# Patient Record
Sex: Female | Born: 1960 | Race: White | Hispanic: No | Smoking: Never smoker
Health system: Southern US, Community
[De-identification: ages and names within clinical notes are randomized; demographics above are authoritative.]

## PROBLEM LIST (undated history)

## (undated) DIAGNOSIS — F4310 Post-traumatic stress disorder, unspecified: Secondary | ICD-10-CM

## (undated) DIAGNOSIS — J45909 Unspecified asthma, uncomplicated: Secondary | ICD-10-CM

## (undated) DIAGNOSIS — E785 Hyperlipidemia, unspecified: Secondary | ICD-10-CM

## (undated) DIAGNOSIS — I10 Essential (primary) hypertension: Secondary | ICD-10-CM

## (undated) DIAGNOSIS — E039 Hypothyroidism, unspecified: Secondary | ICD-10-CM

## (undated) DIAGNOSIS — M797 Fibromyalgia: Secondary | ICD-10-CM

## (undated) DIAGNOSIS — G43909 Migraine, unspecified, not intractable, without status migrainosus: Secondary | ICD-10-CM

## (undated) HISTORY — DX: Fibromyalgia: M79.7

## (undated) HISTORY — DX: Unspecified asthma, uncomplicated: J45.909

## (undated) HISTORY — PX: CHOLECYSTECTOMY: SHX55

## (undated) HISTORY — PX: ADENOIDECTOMY: SUR15

## (undated) HISTORY — PX: OTHER SURGICAL HISTORY: SHX169

## (undated) HISTORY — DX: Hypothyroidism, unspecified: E03.9

## (undated) HISTORY — DX: Migraine, unspecified, not intractable, without status migrainosus: G43.909

---

## 2016-05-03 ENCOUNTER — Ambulatory Visit: Payer: BLUE CROSS/BLUE SHIELD | Admitting: Neurology

## 2016-05-03 ENCOUNTER — Telehealth: Payer: Self-pay | Admitting: Neurology

## 2016-05-03 NOTE — Telephone Encounter (Signed)
This patient did not show for a new patient appointment today. 

## 2016-05-09 ENCOUNTER — Encounter: Payer: Self-pay | Admitting: Neurology

## 2019-06-26 ENCOUNTER — Ambulatory Visit (INDEPENDENT_AMBULATORY_CARE_PROVIDER_SITE_OTHER): Payer: 59 | Admitting: Nurse Practitioner

## 2019-06-26 ENCOUNTER — Encounter: Payer: Self-pay | Admitting: Nurse Practitioner

## 2019-06-26 ENCOUNTER — Other Ambulatory Visit: Payer: Self-pay

## 2019-06-26 VITALS — BP 145/88 | HR 60 | Temp 98.4°F | Ht 64.0 in | Wt 192.8 lb

## 2019-06-26 DIAGNOSIS — R82998 Other abnormal findings in urine: Secondary | ICD-10-CM

## 2019-06-26 DIAGNOSIS — E039 Hypothyroidism, unspecified: Secondary | ICD-10-CM | POA: Diagnosis not present

## 2019-06-26 DIAGNOSIS — Z0000 Encounter for general adult medical examination without abnormal findings: Secondary | ICD-10-CM

## 2019-06-26 DIAGNOSIS — J4520 Mild intermittent asthma, uncomplicated: Secondary | ICD-10-CM

## 2019-06-26 DIAGNOSIS — J329 Chronic sinusitis, unspecified: Secondary | ICD-10-CM

## 2019-06-26 DIAGNOSIS — F419 Anxiety disorder, unspecified: Secondary | ICD-10-CM

## 2019-06-26 DIAGNOSIS — M797 Fibromyalgia: Secondary | ICD-10-CM

## 2019-06-26 LAB — POCT URINALYSIS DIPSTICK
Bilirubin, UA: NEGATIVE
Blood, UA: NEGATIVE
Glucose, UA: NEGATIVE
Ketones, UA: NEGATIVE
Nitrite, UA: NEGATIVE
Protein, UA: POSITIVE — AB
Spec Grav, UA: >=1.03 — AB (ref 1.010–1.025)
Urobilinogen, UA: 0.2 E.U./dL
pH, UA: 6 (ref 5.0–8.0)

## 2019-06-26 LAB — POCT GLYCOSYLATED HEMOGLOBIN (HGB A1C): Hemoglobin A1C: 5.4 % (ref 4.0–5.6)

## 2019-06-26 LAB — GLUCOSE, POCT (MANUAL RESULT ENTRY): POC Glucose: 97 mg/dl (ref 70–99)

## 2019-06-26 MED ORDER — AMOXICILLIN-POT CLAVULANATE 875-125 MG PO TABS: 1.0000 | ORAL_TABLET | Freq: Two times a day (BID) | ORAL | 0 refills | Status: AC

## 2019-06-26 NOTE — Patient Instructions (Addendum)
Managing Anxiety, Adult After being diagnosed with an anxiety disorder, you may be relieved to know why you have felt or behaved a certain way. You may also feel overwhelmed about the treatment ahead and what it will mean for your life. With care and support, you can manage this condition and recover from it. How to manage lifestyle changes Managing stress and anxiety  Stress is your body's reaction to life changes and events, both good and bad. Most stress will last just a few hours, but stress can be ongoing and can lead to more than just stress. Although stress can play a major role in anxiety, it is not the same as anxiety. Stress is usually caused by something external, such as a deadline, test, or competition. Stress normally passes after the triggering event has ended.  Anxiety is caused by something internal, such as imagining a terrible outcome or worrying that something will go wrong that will devastate you. Anxiety often does not go away even after the triggering event is over, and it can become long-term (chronic) worry. It is important to understand the differences between stress and anxiety and to manage your stress effectively so that it does not lead to an anxious response. Talk with your health care provider or a counselor to learn more about reducing anxiety and stress. He or she may suggest tension reduction techniques, such as:  Music therapy. This can include creating or listening to music that you enjoy and that inspires you.  Mindfulness-based meditation. This involves being aware of your normal breaths while not trying to control your breathing. It can be done while sitting or walking.  Centering prayer. This involves focusing on a word, phrase, or sacred image that means something to you and brings you peace.  Deep breathing. To do this, expand your stomach and inhale slowly through your nose. Hold your breath for 3-5 seconds. Then exhale slowly, letting your stomach muscles  relax.  Self-talk. This involves identifying thought patterns that lead to anxiety reactions and changing those patterns.  Muscle relaxation. This involves tensing muscles and then relaxing them. Choose a tension reduction technique that suits your lifestyle and personality. These techniques take time and practice. Set aside 5-15 minutes a day to do them. Therapists can offer counseling and training in these techniques. The training to help with anxiety may be covered by some insurance plans. Other things you can do to manage stress and anxiety include:  Keeping a stress/anxiety diary. This can help you learn what triggers your reaction and then learn ways to manage your response.  Thinking about how you react to certain situations. You may not be able to control everything, but you can control your response.  Making time for activities that help you relax and not feeling guilty about spending your time in this way.  Visual imagery and yoga can help you stay calm and relax.  Medicines Medicines can help ease symptoms. Medicines for anxiety include:  Anti-anxiety drugs.  Antidepressants. Medicines are often used as a primary treatment for anxiety disorder. Medicines will be prescribed by a health care provider. When used together, medicines, psychotherapy, and tension reduction techniques may be the most effective treatment. Relationships Relationships can play a big part in helping you recover. Try to spend more time connecting with trusted friends and family members. Consider going to couples counseling, taking family education classes, or going to family therapy. Therapy can help you and others better understand your condition. How to recognize changes in your   anxiety Everyone responds differently to treatment for anxiety. Recovery from anxiety happens when symptoms decrease and stop interfering with your daily activities at home or work. This may mean that you will start to:  Have  better concentration and focus. Worry will interfere less in your daily thinking.  Sleep better.  Be less irritable.  Have more energy.  Have improved memory. It is important to recognize when your condition is getting worse. Contact your health care provider if your symptoms interfere with home or work and you feel like your condition is not improving. Follow these instructions at home: Activity  Exercise. Most adults should do the following: ? Exercise for at least 150 minutes each week. The exercise should increase your heart rate and make you sweat (moderate-intensity exercise). ? Strengthening exercises at least twice a week.  Get the right amount and quality of sleep. Most adults need 7-9 hours of sleep each night. Lifestyle   Eat a healthy diet that includes plenty of vegetables, fruits, whole grains, low-fat dairy products, and lean protein. Do not eat a lot of foods that are high in solid fats, added sugars, or salt.  Make choices that simplify your life.  Do not use any products that contain nicotine or tobacco, such as cigarettes, e-cigarettes, and chewing tobacco. If you need help quitting, ask your health care provider.  Avoid caffeine, alcohol, and certain over-the-counter cold medicines. These may make you feel worse. Ask your pharmacist which medicines to avoid. General instructions  Take over-the-counter and prescription medicines only as told by your health care provider.  Keep all follow-up visits as told by your health care provider. This is important. Where to find support You can get help and support from these sources:  Self-help groups.  Online and community organizations.  A trusted spiritual leader.  Couples counseling.  Family education classes.  Family therapy. Where to find more information You may find that joining a support group helps you deal with your anxiety. The following sources can help you locate counselors or support groups near  you:  Mental Health America: www.mentalhealthamerica.net  Anxiety and Depression Association of America (ADAA): www.adaa.org  National Alliance on Mental Illness (NAMI): www.nami.org Contact a health care provider if you:  Have a hard time staying focused or finishing daily tasks.  Spend many hours a day feeling worried about everyday life.  Become exhausted by worry.  Start to have headaches, feel tense, or have nausea.  Urinate more than normal.  Have diarrhea. Get help right away if you have:  A racing heart and shortness of breath.  Thoughts of hurting yourself or others. If you ever feel like you may hurt yourself or others, or have thoughts about taking your own life, get help right away. You can go to your nearest emergency department or call:  Your local emergency services (911 in the [place]  A suicide crisis helpline, such as the National Suicide Prevention Lifeline at 1-800-273-8255. This is open 24 hours a day. Summary  Taking steps to learn and use tension reduction techniques can help calm you and help prevent triggering an anxiety reaction.  When used together, medicines, psychotherapy, and tension reduction techniques may be the most effective treatment.  Family, friends, and partners can play a big part in helping you recover from an anxiety disorder. This information is not intended to replace advice given to you by your health care provider. Make sure you discuss any questions you have with your health care provider. Document Revised:   07/29/2018 Document Reviewed: 07/29/2018 Elsevier Patient Education  2020 [employer]   Myofascial Pain Syndrome and Fibromyalgia Myofascial pain syndrome and fibromyalgia are both pain disorders. This pain may be felt mainly in your muscles.  Myofascial pain syndrome: ? Always has tender points in the muscle that will cause pain when pressed (trigger points). The pain may come and go. ? Usually affects your neck,  upper back, and shoulder areas. The pain often radiates into your arms and hands.  Fibromyalgia: ? Has muscle pains and tenderness that come and go. ? Is often associated with fatigue and sleep problems. ? Has trigger points. ? Tends to be long-lasting (chronic), but is not life-threatening. Fibromyalgia and myofascial pain syndrome are not the same. However, they often occur together. If you have both conditions, each can make the other worse. Both are common and can cause enough pain and fatigue to make day-to-day activities difficult. Both can be hard to diagnose because their symptoms are common in many other conditions. What are the causes? The exact causes of these conditions are not known. What increases the risk? You are more likely to develop this condition if:  You have a family history of the condition.  You have certain triggers, such as: ? Spine disorders. ? An injury (trauma) or other physical stressors. ? Being under a lot of stress. ? Medical conditions such as osteoarthritis, rheumatoid arthritis, or lupus. What are the signs or symptoms? Fibromyalgia The main symptom of fibromyalgia is widespread pain and tenderness in your muscles. Pain is sometimes described as stabbing, shooting, or burning. You may also have:  Tingling or numbness.  Sleep problems and fatigue.  Problems with attention and concentration (fibro fog). Other symptoms may include:  Bowel and bladder problems.  Headaches.  Visual problems.  Problems with odors and noises.  Depression or mood changes.  Painful menstrual periods (dysmenorrhea).  Dry skin or eyes. These symptoms can vary over time. Myofascial pain syndrome Symptoms of myofascial pain syndrome include:  Tight, ropy bands of muscle.  Uncomfortable sensations in muscle areas. These may include aching, cramping, burning, numbness, tingling, and weakness.  Difficulty moving certain parts of the body freely (poor range of  motion). How is this diagnosed? This condition may be diagnosed by your symptoms and medical history. You will also have a physical exam. In general:  Fibromyalgia is diagnosed if you have pain, fatigue, and other symptoms for more than 3 months, and symptoms cannot be explained by another condition.  Myofascial pain syndrome is diagnosed if you have trigger points in your muscles, and those trigger points are tender and cause pain elsewhere in your body (referred pain). How is this treated? Treatment for these conditions depends on the type that you have.  For fibromyalgia: ? Pain medicines, such as NSAIDs. ? Medicines for treating depression. ? Medicines for treating seizures. ? Medicines that relax the muscles.  For myofascial pain: ? Pain medicines, such as NSAIDs. ? Cooling and stretching of muscles. ? Trigger point injections. ? Sound wave (ultrasound) treatments to stimulate muscles. Treating these conditions often requires a team of health care providers. These may include:  Your primary care provider.  Physical therapist.  Complementary health care providers, such as massage therapists or acupuncturists.  Psychiatrist for cognitive behavioral therapy. Follow these instructions at home: Medicines  Take over-the-counter and prescription medicines only as told by your health care provider.  Do not drive or use heavy machinery while taking prescription pain medicine.  If you are  taking prescription pain medicine, take actions to prevent or treat constipation. Your health care provider may recommend that you: ? Drink enough fluid to keep your urine pale yellow. ? Eat foods that are high in fiber, such as fresh fruits and vegetables, whole grains, and beans. ? Limit foods that are high in fat and processed sugars, such as fried or sweet foods. ? Take an over-the-counter or prescription medicine for constipation. Lifestyle   Exercise as directed by your health care  provider or physical therapist.  Practice relaxation techniques to control your stress. You may want to try: ? Biofeedback. ? Visual imagery. ? Hypnosis. ? Muscle relaxation. ? Yoga. ? Meditation.  Maintain a healthy lifestyle. This includes eating a healthy diet and getting enough sleep.  Do not use any products that contain nicotine or tobacco, such as cigarettes and e-cigarettes. If you need help quitting, ask your health care provider. General instructions  Talk to your health care provider about complementary treatments, such as acupuncture or massage.  Consider joining a support group with others who are diagnosed with this condition.  Do not do activities that stress or strain your muscles. This includes repetitive motions and heavy lifting.  Keep all follow-up visits as told by your health care provider. This is important. Where to find more information  National Fibromyalgia Association: www.fmaware.org  Arthritis Foundation: www.arthritis.org  American Chronic Pain Association: www.theacpa.org Contact a health care provider if:  You have new symptoms.  Your symptoms get worse or your pain is severe.  You have side effects from your medicines.  You have trouble sleeping.  Your condition is causing depression or anxiety. Summary  Myofascial pain syndrome and fibromyalgia are pain disorders.  Myofascial pain syndrome has tender points in the muscle that will cause pain when pressed (trigger points). Fibromyalgia also has muscle pains and tenderness that come and go, but this condition is often associated with fatigue and sleep disturbances.  Fibromyalgia and myofascial pain syndrome are not the same but often occur together, causing pain and fatigue that make day-to-day activities difficult.  Treatment for fibromyalgia includes taking medicines to relax the muscles and medicines for pain, depression, or seizures. Treatment for myofascial pain syndrome includes  taking medicines for pain, cooling and stretching of muscles, and injecting medicines into trigger points.  Follow your health care provider's instructions for taking medicines and maintaining a healthy lifestyle. This information is not intended to replace advice given to you by your health care provider. Make sure you discuss any questions you have with your health care provider. Document Revised: 06/20/2018 Document Reviewed: 03/13/2017 Elsevier Patient Education  2020 [employer].   Sinusitis, Adult Sinusitis is soreness and swelling (inflammation) of your sinuses. Sinuses are hollow spaces in the bones around your face. They are located:  Around your eyes.  In the middle of your forehead.  Behind your nose.  In your cheekbones. Your sinuses and nasal passages are lined with a fluid called mucus. Mucus drains out of your sinuses. Swelling can trap mucus in your sinuses. This lets germs (bacteria, virus, or fungus) grow, which leads to infection. Most of the time, this condition is caused by a virus. What are the causes? This condition is caused by:  Allergies.  Asthma.  Germs.  Things that block your nose or sinuses.  Growths in the nose (nasal polyps).  Chemicals or irritants in the air.  Fungus (rare). What increases the risk? You are more likely to develop this condition if:  You  have a weak body defense system (immune system).  You do a lot of swimming or diving.  You use nasal sprays too much.  You smoke. What are the signs or symptoms? The main symptoms of this condition are pain and a feeling of pressure around the sinuses. Other symptoms include:  Stuffy nose (congestion).  Runny nose (drainage).  Swelling and warmth in the sinuses.  Headache.  Toothache.  A cough that may get worse at night.  Mucus that collects in the throat or the back of the nose (postnasal drip).  Being unable to smell and taste.  Being very tired (fatigue).  A  fever.  Sore throat.  Bad breath. How is this diagnosed? This condition is diagnosed based on:  Your symptoms.  Your medical history.  A physical exam.  Tests to find out if your condition is short-term (acute) or long-term (chronic). Your doctor may: ? Check your nose for growths (polyps). ? Check your sinuses using a tool that has a light (endoscope). ? Check for allergies or germs. ? Do imaging tests, such as an MRI or CT scan. How is this treated? Treatment for this condition depends on the cause and whether it is short-term or long-term.  If caused by a virus, your symptoms should go away on their own within 10 days. You may be given medicines to relieve symptoms. They include: ? Medicines that shrink swollen tissue in the nose. ? Medicines that treat allergies (antihistamines). ? A spray that treats swelling of the nostrils. ? Rinses that help get rid of thick mucus in your nose (nasal saline washes).  If caused by bacteria, your doctor may wait to see if you will get better without treatment. You may be given antibiotic medicine if you have: ? A very bad infection. ? A weak body defense system.  If caused by growths in the nose, you may need to have surgery. Follow these instructions at home: Medicines  Take, use, or apply over-the-counter and prescription medicines only as told by your doctor. These may include nasal sprays.  If you were prescribed an antibiotic medicine, take it as told by your doctor. Do not stop taking the antibiotic even if you start to feel better. Hydrate and humidify   Drink enough water to keep your pee (urine) pale yellow.  Use a cool mist humidifier to keep the humidity level in your home above 50%.  Breathe in steam for 10-15 minutes, 3-4 times a day, or as told by your doctor. You can do this in the bathroom while a hot shower is running.  Try not to spend time in cool or dry air. Rest  Rest as much as you can.  Sleep with  your head raised (elevated).  Make sure you get enough sleep each night. General instructions   Put a warm, moist washcloth on your face 3-4 times a day, or as often as told by your doctor. This will help with discomfort.  Wash your hands often with soap and water. If there is no soap and water, use hand sanitizer.  Do not smoke. Avoid being around people who are smoking (secondhand smoke).  Keep all follow-up visits as told by your doctor. This is important. Contact a doctor if:  You have a fever.  Your symptoms get worse.  Your symptoms do not get better within 10 days. Get help right away if:  You have a very bad headache.  You cannot stop throwing up (vomiting).  You have very  bad pain or swelling around your face or eyes.  You have trouble seeing.  You feel confused.  Your neck is stiff.  You have trouble breathing. Summary  Sinusitis is swelling of your sinuses. Sinuses are hollow spaces in the bones around your face.  This condition is caused by tissues in your nose that become inflamed or swollen. This traps germs. These can lead to infection.  If you were prescribed an antibiotic medicine, take it as told by your doctor. Do not stop taking it even if you start to feel better.  Keep all follow-up visits as told by your doctor. This is important. This information is not intended to replace advice given to you by your health care provider. Make sure you discuss any questions you have with your health care provider. Document Revised: 07/29/2017 Document Reviewed: 07/29/2017 Elsevier Patient Education  2020 [employer].

## 2019-06-26 NOTE — Progress Notes (Signed)
[facility] Patient [facility] [address] [place], [place]  [address] Phone:  [phone]   Fax:  [phone]   New Patient Office Visit  Subjective:  Patient ID: Kendra Dodson, female    DOB: 1960-12-09  Age: 59 y.o. MRN: [id number]  CC:  Chief Complaint  Patient presents with  . New Patient (Initial Visit)    est care    HPI Kendra Dodson presents for establish care. She  has a past medical history of Asthma, Fibromyalgia, Hypothyroid, and Migraines.  She admits that she is in today to start caring for herself.  She has some increased anxiety because she is ending a 6- year relationship.  She does have an appointment with a therapist on 07/08/2019.  She has a history of anxiety.  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  She denies depression.  She admits that she is able to get around her depression. She does work full-time.  She has started exercising and eating right.  She has lost about 13 pounds in the last few weeks.  Sinus Pain Patient complains of facial pain, post nasal drip and purulent rhinorrhea. Onset of symptoms was 2 weeks ago. Symptoms have been unchanged since that time. She is drinking plenty of fluids.  Past history is significant for asthma. Patient is non-smoker.   Past Medical History:  Diagnosis Date  . Asthma   . Fibromyalgia   . Hypothyroid   . Migraines       Family History  Problem Relation Age of Onset  . Hypertension Mother   . Hypertension Father   . Diabetes Father     Social History   Socioeconomic History  . Marital status: Divorced    Spouse name: Not on file  . Number of children: Not on file  . Years of education: Not on file  . Highest education level: Not on file  Occupational History  . Not on file  Tobacco Use  . Smoking status: Never Smoker  . Smokeless tobacco: Never Used  Substance and Sexual Activity  . Alcohol use: Yes    Comment: rare  . Drug use: Never  . Sexual activity: Not Currently  Other  Topics Concern  . Not on file  Social History Narrative  . Not on file   Social Determinants of Health   Financial Resource Strain:   . Difficulty of Paying Living Expenses:   Food Insecurity:   . Worried About Programme researcher, broadcasting/film/video in the Last Year:   . Barista in the Last Year:   Transportation Needs:   . Freight forwarder (Medical):   Marland Kitchen Lack of Transportation (Non-Medical):   Physical Activity:   . Days of Exercise per Week:   . Minutes of Exercise per Session:   Stress:   . Feeling of Stress :   Social Connections:   . Frequency of Communication with Friends and Family:   . Frequency of Social Gatherings with Friends and Family:   . Attends Religious Services:   . Active Member of Clubs or Organizations:   . Attends Banker Meetings:   Marland Kitchen Marital Status:   Intimate Partner Violence:   . Fear of Current or Ex-Partner:   . Emotionally Abused:   Marland Kitchen Physically Abused:   . Sexually Abused:     ROS Review of Systems  Constitutional: Negative.   HENT: Positive for postnasal drip and sinus pressure.   Eyes: Negative.  Respiratory: Negative.        Shortness of breath has improved  Cardiovascular: Negative.   Gastrointestinal: Negative.   Endocrine: Negative.   Neurological: Positive for weakness and numbness.       Hx of weakness and was seen neurology Hx of childhood "blows"  N/T and in left hand aura prior to migrianes Occasional right lip tingling   Hematological: Negative.   Psychiatric/Behavioral:       Anxiety     Objective:   Today's Vitals: BP (!) 145/88   Pulse 60   Temp 98.4 F (36.9 C)   Ht 5\' 4"  (1.626 m)   Wt 192 lb 12.8 oz (87.5 kg)   SpO2 99%   BMI 33.09 kg/m   Physical Exam Constitutional:      Appearance: She is obese.  HENT:     Head: Normocephalic and atraumatic.     Right Ear: Tympanic membrane normal.     Left Ear: Tympanic membrane normal.     Ears:     Comments: Slight scarring in the left tympanic  membrane    Nose: Nose normal.     Mouth/Throat:     Mouth: Mucous membranes are moist.  Eyes:     Pupils: Pupils are equal, round, and reactive to light.  Cardiovascular:     Rate and Rhythm: Normal rate and regular rhythm.     Pulses: Normal pulses.     Heart sounds: Normal heart sounds.  Pulmonary:     Effort: Pulmonary effort is normal.     Breath sounds: Normal breath sounds.  Abdominal:     General: Bowel sounds are normal.     Palpations: Abdomen is soft.  Musculoskeletal:        General: Normal range of motion.     Cervical back: Normal range of motion and neck supple.  Skin:    General: Skin is warm.  Neurological:     General: No focal deficit present.     Mental Status: She is alert and oriented to person, place, and time.  Psychiatric:        Mood and Affect: Mood normal.        Behavior: Behavior normal.        Thought Content: Thought content normal.        Judgment: Judgment normal.     Assessment & Plan:   Problem List Items Addressed This Visit    None    Visit Diagnoses    Health care maintenance    -  Primary   Labs pending Well woman exam to be scheduled   Relevant Orders   POCT glucose (manual entry) (Completed)   POCT urinalysis dipstick (Completed)   POCT glycosylated hemoglobin (Hb A1C) (Completed)   CBC with Differential/Platelet   Comp. Metabolic Panel (12)   Lipid panel   Vitamin B12   Magnesium   Sedimentation rate   Leukocytes in urine       Culture pending   Relevant Orders   Urine Culture   Hypothyroidism, unspecified type       Labs pending   Relevant Orders   TSH   T4, free   T3   Chronic sinusitis, unspecified location       Empirical treatment with Augmentin per patient's request   Relevant Medications   amoxicillin-clavulanate (AUGMENTIN) 875-125 MG tablet   Mild intermittent asthma without complication       We will continue to monitor   Fibromyalgia  Encouraged monitoring diet and exercise   Recurrent  anxiety       Follow-up with counselor as scheduled      Outpatient Encounter Medications as of 06/26/2019  Medication Sig  . amoxicillin-clavulanate (AUGMENTIN) 875-125 MG tablet Take 1 tablet by mouth 2 (two) times daily for 10 days.   No facility-administered encounter medications on file as of 06/26/2019.    Follow-up: Return for well woman [place], NP

## 2019-06-27 LAB — TSH: TSH: 10.9 u[IU]/mL — ABNORMAL HIGH (ref 0.450–4.500)

## 2019-06-27 LAB — CBC WITH DIFFERENTIAL/PLATELET
Basophils Absolute: 0.1 10*3/uL (ref 0.0–0.2)
Basos: 1 %
EOS (ABSOLUTE): 0.1 10*3/uL (ref 0.0–0.4)
Eos: 1 %
Hematocrit: 43.5 % (ref 34.0–46.6)
Hemoglobin: 14.6 g/dL (ref 11.1–15.9)
Immature Grans (Abs): 0 10*3/uL (ref 0.0–0.1)
Immature Granulocytes: 0 %
Lymphocytes Absolute: 2.6 10*3/uL (ref 0.7–3.1)
Lymphs: 30 %
MCH: 30.9 pg (ref 26.6–33.0)
MCHC: 33.6 g/dL (ref 31.5–35.7)
MCV: 92 fL (ref 79–97)
Monocytes Absolute: 0.6 10*3/uL (ref 0.1–0.9)
Monocytes: 7 %
Neutrophils Absolute: 5.2 10*3/uL (ref 1.4–7.0)
Neutrophils: 61 %
Platelets: 328 10*3/uL (ref 150–450)
RBC: 4.72 x10E6/uL (ref 3.77–5.28)
RDW: 12.5 % (ref 11.7–15.4)
WBC: 8.7 10*3/uL (ref 3.4–10.8)

## 2019-06-27 LAB — COMP. METABOLIC PANEL (12)
AST: 17 IU/L (ref 0–40)
Albumin/Globulin Ratio: 1.6 (ref 1.2–2.2)
Albumin: 4.6 g/dL (ref 3.8–4.9)
Alkaline Phosphatase: 84 IU/L (ref 39–117)
BUN/Creatinine Ratio: 13 (ref 9–23)
BUN: 12 mg/dL (ref 6–24)
Bilirubin Total: 0.4 mg/dL (ref 0.0–1.2)
Calcium: 9.9 mg/dL (ref 8.7–10.2)
Chloride: 102 mmol/L (ref 96–106)
Creatinine, Ser: 0.93 mg/dL (ref 0.57–1.00)
GFR calc Af Amer: 79 mL/min/{1.73_m2} (ref >59)
GFR calc non Af Amer: 69 mL/min/{1.73_m2} (ref >59)
Globulin, Total: 2.9 g/dL (ref 1.5–4.5)
Glucose: 84 mg/dL (ref 65–99)
Potassium: 4.4 mmol/L (ref 3.5–5.2)
Sodium: 137 mmol/L (ref 134–144)
Total Protein: 7.5 g/dL (ref 6.0–8.5)

## 2019-06-27 LAB — LIPID PANEL
Chol/HDL Ratio: 3.4 ratio (ref 0.0–4.4)
Cholesterol, Total: 230 mg/dL — ABNORMAL HIGH (ref 100–199)
HDL: 68 mg/dL (ref >39)
LDL Chol Calc (NIH): 148 mg/dL — ABNORMAL HIGH (ref 0–99)
Triglycerides: 82 mg/dL (ref 0–149)
VLDL Cholesterol Cal: 14 mg/dL (ref 5–40)

## 2019-06-27 LAB — T4, FREE: Free T4: 1.04 ng/dL (ref 0.82–1.77)

## 2019-06-27 LAB — VITAMIN B12: Vitamin B-12: 361 pg/mL (ref 232–1245)

## 2019-06-27 LAB — T3: T3, Total: 127 ng/dL (ref 71–180)

## 2019-06-27 LAB — SEDIMENTATION RATE: Sed Rate: 2 mm/hr (ref 0–40)

## 2019-06-27 LAB — MAGNESIUM: Magnesium: 2.2 mg/dL (ref 1.6–2.3)

## 2019-06-28 LAB — URINE CULTURE

## 2019-06-29 ENCOUNTER — Other Ambulatory Visit: Payer: Self-pay | Admitting: Nurse Practitioner

## 2019-06-29 ENCOUNTER — Telehealth: Payer: Self-pay

## 2019-06-29 MED ORDER — LEVOTHYROXINE SODIUM 25 MCG PO TABS: 25.0000 ug | ORAL_TABLET | Freq: Every day | ORAL | 2 refills | Status: DC

## 2019-06-29 NOTE — Telephone Encounter (Signed)
-----   Message from Barbette Merino, NP sent at 06/29/2019  1:56 PM EDT ----- Please make Ms. Mill  aware that time cholesterol is 230 LDL 148.  We will allow her 3 months of diet, exercise and weight loss then reevaluate.  Her TSH is elevated.  We will start levothyroxine 25 mcg daily repeat labs in 3 months.

## 2019-06-29 NOTE — Telephone Encounter (Signed)
Called, no answer. Left a voicemail to call back.

## 2019-06-30 NOTE — Telephone Encounter (Signed)
Called and spoke with patient, advised that cholesterol is elevated and asked that she work on diet and exercise for this and we will recheck in 3 months. Also advised that tsh is elevated and she needs to start taking levothyroxine once daily before breakfast and we can repeat this in 3 months as well. Patient verbalized understanding. Thanks!

## 2019-07-27 ENCOUNTER — Other Ambulatory Visit: Payer: Self-pay

## 2019-07-27 ENCOUNTER — Encounter: Payer: Self-pay | Admitting: Nurse Practitioner

## 2019-07-27 ENCOUNTER — Ambulatory Visit (INDEPENDENT_AMBULATORY_CARE_PROVIDER_SITE_OTHER): Payer: 59 | Admitting: Nurse Practitioner

## 2019-07-27 VITALS — BP 132/90 | HR 66 | Temp 98.2°F | Ht 64.0 in | Wt 189.0 lb

## 2019-07-27 DIAGNOSIS — G8929 Other chronic pain: Secondary | ICD-10-CM | POA: Diagnosis not present

## 2019-07-27 DIAGNOSIS — Z0000 Encounter for general adult medical examination without abnormal findings: Secondary | ICD-10-CM

## 2019-07-27 DIAGNOSIS — Z01419 Encounter for gynecological examination (general) (routine) without abnormal findings: Secondary | ICD-10-CM

## 2019-07-27 DIAGNOSIS — Z23 Encounter for immunization: Secondary | ICD-10-CM

## 2019-07-27 DIAGNOSIS — M79672 Pain in left foot: Secondary | ICD-10-CM

## 2019-07-27 DIAGNOSIS — R82998 Other abnormal findings in urine: Secondary | ICD-10-CM

## 2019-07-27 LAB — POCT URINALYSIS DIPSTICK
Bilirubin, UA: NEGATIVE
Glucose, UA: NEGATIVE
Ketones, UA: NEGATIVE
Nitrite, UA: NEGATIVE
Protein, UA: POSITIVE — AB
Spec Grav, UA: >=1.03 — AB (ref 1.010–1.025)
Urobilinogen, UA: 0.2 E.U./dL
pH, UA: 5.5 (ref 5.0–8.0)

## 2019-07-27 NOTE — Patient Instructions (Addendum)
Health Maintenance, Female Adopting a healthy lifestyle and getting preventive care are important in promoting health and wellness. Ask your health care provider about:  The right schedule for you to have regular tests and exams.  Things you can do on your own to prevent diseases and keep yourself healthy. What should I know about diet, weight, and exercise? Eat a healthy diet   Eat a diet that includes plenty of vegetables, fruits, low-fat dairy products, and lean protein.  Do not eat a lot of foods that are high in solid fats, added sugars, or sodium. Maintain a healthy weight Body mass index (BMI) is used to identify weight problems. It estimates body fat based on height and weight. Your health care provider can help determine your BMI and help you achieve or maintain a healthy weight. Get regular exercise Get regular exercise. This is one of the most important things you can do for your health. Most adults should:  Exercise for at least 150 minutes each week. The exercise should increase your heart rate and make you sweat (moderate-intensity exercise).  Do strengthening exercises at least twice a week. This is in addition to the moderate-intensity exercise.  Spend less time sitting. Even light physical activity can be beneficial. Watch cholesterol and blood lipids Have your blood tested for lipids and cholesterol at 57 years of age, then have this test every 5 years. Have your cholesterol levels checked more often if:  Your lipid or cholesterol levels are high.  You are older than 57 years of age.  You are at high risk for heart disease. What should I know about cancer screening? Depending on your health history and family history, you may need to have cancer screening at various ages. This may include screening for:  Breast cancer.  Cervical cancer.  Colorectal cancer.  Skin cancer.  Lung cancer. What should I know about heart disease, diabetes, and high blood  pressure? Blood pressure and heart disease  High blood pressure causes heart disease and increases the risk of stroke. This is more likely to develop in people who have high blood pressure readings, are of African descent, or are overweight.  Have your blood pressure checked: ? Every 3-5 years if you are 54-9 years of age. ? Every year if you are 69 years old or older. Diabetes Have regular diabetes screenings. This checks your fasting blood sugar level. Have the screening done:  Once every three years after age 36 if you are at a normal weight and have a low risk for diabetes.  More often and at a younger age if you are overweight or have a high risk for diabetes. What should I know about preventing infection? Hepatitis B If you have a higher risk for hepatitis B, you should be screened for this virus. Talk with your health care provider to find out if you are at risk for hepatitis B infection. Hepatitis C Testing is recommended for:  Everyone born from [birth year] through 1963.  Anyone with known risk factors for hepatitis C. Sexually transmitted infections (STIs)  Get screened for STIs, including gonorrhea and chlamydia, if: ? You are sexually active and are younger than 57 years of age. ? You are older than 57 years of age and your health care provider tells you that you are at risk for this type of infection. ? Your sexual activity has changed since you were last screened, and you are at increased risk for chlamydia or gonorrhea. Ask your health care provider  if you are at risk.  Ask your health care provider about whether you are at high risk for HIV. Your health care provider may recommend a prescription medicine to help prevent HIV infection. If you choose to take medicine to prevent HIV, you should first get tested for HIV. You should then be tested every 3 months for as long as you are taking the medicine. Pregnancy  If you are about to stop having your period (premenopausal) and  you may become pregnant, seek counseling before you get pregnant.  Take 400 to 800 micrograms (mcg) of folic acid every day if you become pregnant.  Ask for birth control (contraception) if you want to prevent pregnancy. Osteoporosis and menopause Osteoporosis is a disease in which the bones lose minerals and strength with aging. This can result in bone fractures. If you are 82 years old or older, or if you are at risk for osteoporosis and fractures, ask your health care provider if you should:  Be screened for bone loss.  Take a calcium or vitamin D supplement to lower your risk of fractures.  Be given hormone replacement therapy (HRT) to treat symptoms of menopause. Follow these instructions at home: Lifestyle  Do not use any products that contain nicotine or tobacco, such as cigarettes, e-cigarettes, and chewing tobacco. If you need help quitting, ask your health care provider.  Do not use street drugs.  Do not share needles.  Ask your health care provider for help if you need support or information about quitting drugs. Alcohol use  Do not drink alcohol if: ? Your health care provider tells you not to drink. ? You are pregnant, may be pregnant, or are planning to become pregnant.  If you drink alcohol: ? Limit how much you use to 0-1 drink a day. ? Limit intake if you are breastfeeding.  Be aware of how much alcohol is in your drink. In the [place] one drink equals one 12 oz bottle of beer (355 mL), one 5 oz glass of wine (148 mL), or one 1 oz glass of hard liquor (44 mL). General instructions  Schedule regular health, dental, and eye exams.  Stay current with your vaccines.  Tell your health care provider if: ? You often feel depressed. ? You have ever been abused or do not feel safe at home. Summary  Adopting a healthy lifestyle and getting preventive care are important in promoting health and wellness.  Follow your health care provider's instructions about healthy  diet, exercising, and getting tested or screened for diseases.  Follow your health care provider's instructions on monitoring your cholesterol and blood pressure. This information is not intended to replace advice given to you by your health care provider. Make sure you discuss any questions you have with your health care provider. Document Revised: 02/19/2018 Document Reviewed: 02/19/2018 Elsevier Patient Education  2020 [employer].   Colonoscopy, Adult A colonoscopy is an exam to look at the large intestine. It is done using a long, thin, flexible tube that has a camera on the end. This exam is done to check for problems, such as:  Lumps (tumors).  Growths (polyps).  Irritation and swelling (inflammation).  Bleeding. Tell your doctor about:  Any allergies you have.  All medicines you are taking. Tell him or her about vitamins, herbs, eye drops, creams, and over-the-counter medicines.  Any problems you or family members have had with anesthetic medicines.  Any blood disorders you have.  Any surgeries you have had.  Any  medical conditions you have.  Whether you are pregnant or may be pregnant.  Any problems you have had with pooping (having bowel movements). What are the risks? Generally, this is a safe procedure. However, problems may occur, such as:  Bleeding.  Damage to your intestine.  Allergic reactions to medicines given during the procedure.  Infection. This is rare. What happens before the procedure? Eating and drinking Follow instructions from your doctor about eating and drinking. These may include:  A few days before the procedure: ? Follow a low-fiber diet. ? Avoid these foods:  Nuts.  Seeds.  Dried fruit.  Raw fruits.  Vegetables.  1-3 days before the procedure: ? Eat only gelatin dessert or ice pops. ? Drink only clear liquids, such as:  Water.  Clear broth or bouillon.  Black coffee or tea.  Clear juice.  Clear soft drinks  or sports drinks. ? Avoid liquids that have red or purple dye.  On the day of the procedure: ? Do not eat solid foods. You may continue to drink clear liquids until up to 2 hours before the procedure. ? Do not eat or drink anything starting 2 hours before the procedure or as told by your doctor. Bowel prep If you were prescribed a bowel prep to take by mouth (orally) to clean out your colon:  Take it as told by your doctor. Starting the day before your procedure, you will need to drink a lot of liquid medicine. The liquid will cause you to poop until your poop is almost clear or light green.  If your skin or butt gets irritated from diarrhea, you may: ? Wipe the area with wipes that have medicine in them, such as adult wet wipes with aloe and vitamin E. ? Put something on your skin that soothes the area, such as petroleum jelly.  If you vomit while drinking the bowel prep: ? Take a break for up to 60 minutes. ? Begin the bowel prep again. ? Call your doctor if you keep vomiting and you cannot take the bowel prep without vomiting.  To clean out your colon, you may also be given: ? Laxative medicines. These help you poop. ? Instructions for using a liquid medicine (enema) injected into your butt. Medicines Ask your doctor about:  Changing or stopping your normal medicines. This is important.  Taking aspirin and ibuprofen. Do not take these medicines unless your doctor tells you to take them.  Taking over-the-counter medicines, vitamins, herbs, and supplements. General instructions  Ask your doctor what steps will be taken to help prevent the spread of germs. These may include washing skin with a germ-killing soap.  Plan to have someone take you home from the hospital or clinic. What happens during the procedure?   An IV tube may be put into one of your veins.  You may be given one or more of the following: ? A medicine to help you relax (sedative). ? A medicine to numb the  area (local anesthetic). ? A medicine to make you fall asleep (general anesthetic). This is rarely needed.  You will lie on your side with your knees bent.  The tube will: ? Have oil or gel put on it. ? Be put into the opening of the butt (anus). ? Be gently put into your large intestine.  Air will be sent into your colon to keep it open. You may feel some pressure or cramping.  The camera will be used to take photos that will appear  on a screen.  A small tissue sample may be removed for testing (biopsy).  If small growths are found, your doctor may remove them and have them checked for cancer.  The tube will be slowly removed. The procedure may vary among doctors and hospitals. What happens after the procedure?  You will be monitored until you leave the hospital or clinic. This includes checking your: ? Blood pressure. ? Heart rate. ? Breathing rate. ? Blood oxygen level.  You may have a small amount of blood in your poop.  You may pass gas.  You may have mild cramps or bloating in your belly (abdomen).  Do not drive for 24 hours after the procedure.  It is up to you to get the results of your procedure. Ask your doctor, or the department that is doing the procedure, when your results will be ready. Summary  A colonoscopy is an exam to look at the large intestine.  Follow instructions from your doctor about eating and drinking before the procedure.  You may be prescribed an oral bowel prep to clean out your colon. Take it as told by your doctor.  A flexible tube with a camera on its end will be put into the opening of the butt. It will be passed into the large intestine.  You will be monitored until you leave the hospital or clinic. This information is not intended to replace advice given to you by your health care provider. Make sure you discuss any questions you have with your health care provider. Document Revised: 09/19/2018 Document Reviewed: 09/19/2018  Elsevier Patient Education  2020 [employer]  Heel Spur  A heel spur is a bony growth that forms on the bottom of the heel bone (calcaneus). Heel spurs are common. They often cause inflammation in the band of tissue that connects the toes to the heel bone (plantar fascia). This may cause pain on the bottom of the foot, near the heel. Many people with plantar fasciitis also have heel spurs. However, spurs are not the cause of plantar fasciitis pain. What are the causes? The exact cause of heel spurs is not known. They may be caused by:  Pressure on the heel bone.  Bands of tissue (tendons) pulling on the heel bone. What increases the risk? You are more likely to develop this condition if you:  Are older than 40.  Are overweight.  Have wear-and-tear arthritis (osteoarthritis).  Have plantar fascia inflammation.  Participate in sports or activities that include a lot of running or jumping.  Wear poorly fitted shoes. What are the signs or symptoms? Some people have no symptoms. If you do have symptoms, they may include:  Pain in the bottom of your heel.  Pain that is worse when you first get out of bed.  Pain that gets worse after walking or standing. How is this diagnosed? This condition may be diagnosed based on:  Your symptoms and medical history.  A physical exam.  A foot X-ray. How is this treated? Treatment for this condition depends on how much pain you have. Treatment options may include:  Doing stretching exercises.  Losing weight, if necessary.  Wearing specific shoes or inserts inside of shoes (orthotics) for comfort and support.  Wearing splints on your feet while you sleep. Splints keep your feet in a position (usually 90 degrees) that should prevent and relieve the pain you feel when you first get out of bed. They also make stretching easier in the morning.  Taking over-the-counter  medicine to relieve pain, such as NSAIDs.  Using high-intensity  sound waves to break up the heel spur (extracorporeal shock wave therapy).  Getting steroid injections in your heel to reduce inflammation.  Having surgery, if your heel spur causes long-term (chronic) pain. Follow these instructions at home:  Activity  Avoid activities that cause pain until you recover, or for as long as directed by your health care provider.  Do stretching exercises as directed. Stretch before exercising or being physically active. Managing pain, stiffness, and swelling  If directed, put ice on your foot: ? Put ice in a plastic bag. ? Place a towel between your skin and the bag. ? Leave the ice on for 20 minutes, 2-3 times a day.  Move your toes often to avoid stiffness and to lessen swelling.  When possible, raise (elevate) your foot above the level of your heart while you are sitting or lying down. General instructions  Take over-the-counter and prescription medicines only as told by your health care provider.  Wear supportive shoes that fit well. Wear splints, inserts, or orthotics as told by your health care provider.  If recommended, work with your health care provider to lose weight. This can relieve pressure on your foot.  Do not use any products that contain nicotine or tobacco, such as cigarettes and e-cigarettes. These can affect bone growth and healing. If you need help quitting, ask your health care provider.  Keep all follow-up visits as told by your health care provider. This is important. Contact a health care provider if:  Your pain does not go away with treatment.  Your pain gets worse. Summary  A heel spur is a bony growth that forms on the bottom of the heel bone (calcaneus).  Heel spurs often cause inflammation in the band of tissue that connects the toes to the heel bone (plantar fascia). This may cause pain on the bottom of the foot, near the heel.  Doing stretching exercises, losing weight, wearing specific shoes or shoe inserts,  wearing splints while you sleep, and taking pain medicine may ease the pain and stiffness.  Other treatment options may include high-intensity sound waves to break up the heel spur, steroid injections, or surgery. This information is not intended to replace advice given to you by your health care provider. Make sure you discuss any questions you have with your health care provider. Document Revised: 02/13/2017 Document Reviewed: 02/13/2017 Elsevier Patient Education  2020 [employer] Inc[employer]  Heel Pad Atrophy  Heel pad atrophy is a cause of heel pain. Your heels take much of the impact when you stand, walk, or run. A fat pad under your heel bone protects the bone and cushions it. Over time, the fat pad can break down and lose elasticity and size (atrophy). This causes heel pain due to decreased cushioning in your heel. In many cases, the pain occurs in both heels. You may feel an aching or burning pain that gets worse while you are walking or standing. This pain may be worse for athletes who play sports on hard surfaces with high impact on their heels. What are the causes? This condition is caused by the gradual break down of the fat pad under the heel bone. What increases the risk? This condition is more likely to develop in people who:  Are age 74 or older.  Play a sport that involves jumping and landing on hard surfaces, such as in basketball.  Are runners, especially if they land on their heels first  when they run.  Are overweight.  Have diabetes, rheumatoid arthritis, or blood vessel disease.  Get steroid injections in the heel area.  Have had trauma to the heel area, such as falling from a height. What are the signs or symptoms? The most common symptom of this condition is burning or aching pain in one or both heels. The pain or tenderness may be worse when:  Walking or standing, especially on hard surfaces or for long amounts of time.  Walking barefoot.  Wearing hard-soled  shoes.  Resting at night.  Pressing on the center of the heel. How is this diagnosed? This condition is diagnosed based on your symptoms, medical history, and a physical exam. Your health care provider will check your heel pad and see if you have tenderness in the center of your heel. You may also have an ultrasound to confirm the diagnosis and measure the thickness of your fat pad. How is this treated? Treatment for this condition includes:  Lessening the amount of time spent walking, standing, or running.  Avoiding activities that cause pain.  Taking an over-the-counter pain reliever or anti-inflammatory medicine.  Wearing shoes with thick heel cushioning or using a soft shoe insert (orthotic).  Wearing shoes at all times, even indoors.  Taping your heels to increase support.  Losing weight if you are overweight. Follow these instructions at home: Managing pain, stiffness, and swelling   If directed, put ice on the injured area. ? Put ice in a plastic bag. ? Place a towel between your heel and the bag. ? Leave the ice on for 20 minutes, 2-3 times a day.  Move your toes often to lessen stiffness and swelling.  Raise (elevate) the foot above the level of your heart while you are sitting or lying down. Activity  Return to your normal activities as told by your health care provider. Ask your health care provider what activities are safe for you.  Do exercises as told by your health care provider. General instructions  Take over-the-counter and prescription medicines only as told by your health care provider.  Wear orthotics and supportive shoes as told by your health care provider. Do not wear high heels.  Do not use any products that contain nicotine or tobacco, such as cigarettes, e-cigarettes, and chewing tobacco. If you need help quitting, ask your health care provider.  If you are overweight, work with your health care provider to reach a healthy weight.  Keep all  follow-up visits as told by your health care provider. This is important. How is this prevented?  Give your body time to rest between periods of activity.  Wear comfortable and supportive shoes during athletic activity.  Maintain a healthy weight.  Manage long-term (chronic) health conditions, such as diabetes, high blood pressure, or blood vessel disease. Contact a health care provider if:  Your symptoms do not improve or they get worse.  You have concerns about your orthotics and shoes. Summary  Heel pad atrophy is a cause of heel pain.  This condition is caused by the gradual breakdown of the fat pad under the heel bone.  Treatment may include avoiding activities that cause pain, using a soft shoe insert (orthotic), and maintaining a healthy weight. This information is not intended to replace advice given to you by your health care provider. Make sure you discuss any questions you have with your health care provider. Document Revised: 03/03/2018 Document Reviewed: 03/03/2018 Elsevier Patient Education  [place].  Plantar Fasciitis Rehab  Ask your health care provider which exercises are safe for you. Do exercises exactly as told by your health care provider and adjust them as directed. It is normal to feel mild stretching, pulling, tightness, or discomfort as you do these exercises. Stop right away if you feel sudden pain or your pain gets worse. Do not begin these exercises until told by your health care provider. Stretching and range-of-motion exercises These exercises warm up your muscles and joints and improve the movement and flexibility of your foot. These exercises also help to relieve pain. Plantar fascia stretch  1. Sit with your left / right leg crossed over your opposite knee. 2. Hold your heel with one hand with that thumb near your arch. With your other hand, hold your toes and gently pull them back toward the top of your foot. You should feel a stretch on  the bottom of your toes or your foot (plantar fascia) or both. 3. Hold this stretch for__________ seconds. 4. Slowly release your toes and return to the starting position. Repeat __________ times. Complete this exercise __________ times a day. Gastrocnemius stretch, standing This exercise is also called a calf (gastroc) stretch. It stretches the muscles in the back of the upper calf. 1. Stand with your hands against a wall. 2. Extend your left / right leg behind you, and bend your front knee slightly. 3. Keeping your heels on the floor and your back knee straight, shift your weight toward the wall. Do not arch your back. You should feel a gentle stretch in your upper left / right calf. 4. Hold this position for __________ seconds. Repeat __________ times. Complete this exercise __________ times a day. Soleus stretch, standing This exercise is also called a calf (soleus) stretch. It stretches the muscles in the back of the lower calf. 1. Stand with your hands against a wall. 2. Extend your left / right leg behind you, and bend your front knee slightly. 3. Keeping your heels on the floor, bend your back knee and shift your weight slightly over your back leg. You should feel a gentle stretch deep in your lower calf. 4. Hold this position for __________ seconds. Repeat __________ times. Complete this exercise __________ times a day. Gastroc and soleus stretch, standing step This exercise stretches the muscles in the back of the lower leg. These muscles are in the upper calf (gastrocnemius) and the lower calf (soleus). 1. Stand with the ball of your left / right foot on a step. The ball of your foot is on the walking surface, right under your toes. 2. Keep your other foot firmly on the same step. 3. Hold on to the wall or a railing for balance. 4. Slowly lift your other foot, allowing your body weight to press your left / right heel down over the edge of the step. You should feel a stretch in your  left / right calf. 5. Hold this position for __________ seconds. 6. Return both feet to the step. 7. Repeat this exercise with a slight bend in your left / right knee. Repeat __________ times with your left / right knee straight and __________ times with your left / right knee bent. Complete this exercise __________ times a day. Balance exercise This exercise builds your balance and strength control of your arch to help take pressure off your plantar fascia. Single leg stand If this exercise is too easy, you can try it with your eyes closed or while standing on a pillow. 1. Without shoes,  stand near a railing or in a doorway. You may hold on to the railing or door frame as needed. 2. Stand on your left / right foot. Keep your big toe down on the floor and try to keep your arch lifted. Do not let your foot roll inward. 3. Hold this position for __________ seconds. Repeat __________ times. Complete this exercise __________ times a day. This information is not intended to replace advice given to you by your health care provider. Make sure you discuss any questions you have with your health care provider. Document Revised: 06/19/2018 Document Reviewed: 12/25/2017 Elsevier Patient Education  2020 [employer].

## 2019-07-27 NOTE — Progress Notes (Signed)
[facility] Patient [facility] [address] [place], [place]  [address] Phone:  [phone]   Fax:  [phone]   Established Patient Office Visit  Subjective:  Patient ID: Kendra Dodson, female    DOB: 1960-04-08  Age: 59 y.o. MRN: [id number]  CC:  Chief Complaint  Patient presents with  . Annual Exam    physical exam and pap smear     HPI Kendra Dodson presents for annual exam. She  has a past medical history of Asthma, Fibromyalgia, Hypothyroid, and Migraines.    Past Medical History:  Diagnosis Date  . Asthma   . Fibromyalgia   . Hypothyroid   . Migraines     Past Surgical History:  Procedure Laterality Date  . ADENOIDECTOMY    . CHOLECYSTECTOMY      Family History  Problem Relation Age of Onset  . Hypertension Mother   . Hypertension Father   . Diabetes Father     Social History   Socioeconomic History  . Marital status: Divorced    Spouse name: Not on file  . Number of children: Not on file  . Years of education: Not on file  . Highest education level: Not on file  Occupational History  . Not on file  Tobacco Use  . Smoking status: Never Smoker  . Smokeless tobacco: Never Used  Substance and Sexual Activity  . Alcohol use: Yes    Comment: rare  . Drug use: Never  . Sexual activity: Not Currently  Other Topics Concern  . Not on file  Social History Narrative  . Not on file   Social Determinants of Health   Financial Resource Strain:   . Difficulty of Paying Living Expenses:   Food Insecurity:   . Worried About Programme researcher, broadcasting/film/video in the Last Year:   . Barista in the Last Year:   Transportation Needs:   . Freight forwarder (Medical):   Marland Kitchen Lack of Transportation (Non-Medical):   Physical Activity:   . Days of Exercise per Week:   . Minutes of Exercise per Session:   Stress:   . Feeling of Stress :   Social Connections:   . Frequency of Communication with Friends and Family:   . Frequency of Social Gatherings with  Friends and Family:   . Attends Religious Services:   . Active Member of Clubs or Organizations:   . Attends Banker Meetings:   Marland Kitchen Marital Status:   Intimate Partner Violence:   . Fear of Current or Ex-Partner:   . Emotionally Abused:   Marland Kitchen Physically Abused:   . Sexually Abused:     Outpatient Medications Prior to Visit  Medication Sig Dispense Refill  . levothyroxine (SYNTHROID) 25 MCG tablet Take 1 tablet (25 mcg total) by mouth daily before breakfast. 30 tablet 2   No facility-administered medications prior to visit.    No Known Allergies  ROS Review of Systems  Musculoskeletal:       Left heel pain      Objective:    Physical Exam  Constitutional: She is oriented to person, place, and time. She appears well-developed. No distress.  HENT:  Head: Normocephalic.  Eyes: Pupils are equal, round, and reactive to light.  Pulmonary/Chest: Right breast exhibits no inverted nipple, no mass, no nipple discharge, no skin change and no tenderness. Left breast exhibits no inverted nipple, no mass, no nipple discharge, no skin change and no tenderness.  Genitourinary:  Vagina normal.     No vaginal discharge.     Genitourinary Comments: Blood tinge to cervical os no active bleeding   Musculoskeletal:     Cervical back: Normal range of motion.  Neurological: She is alert and oriented to person, place, and time.  Skin: Skin is warm and dry.  Psychiatric: She has a normal mood and affect. Her behavior is normal. Judgment and thought content normal.    BP 132/90 (BP Location: Left Arm, Patient Position: Sitting)   Pulse 66   Temp 98.2 F (36.8 C)   Ht 5\' 4"  (1.626 m)   Wt 189 lb (85.7 kg)   SpO2 98%   BMI 32.44 kg/m  Wt Readings from Last 3 Encounters:  07/27/19 189 lb (85.7 kg)  06/26/19 192 lb 12.8 oz (87.5 kg)     Health Maintenance Due  Topic Date Due  . Hepatitis C Screening  Never done  . HIV Screening  Never done  . PAP SMEAR-Modifier  Never  done  . MAMMOGRAM  Never done  . COLONOSCOPY  Never done    There are no preventive care reminders to display for this patient.  Lab Results  Component Value Date   TSH 10.900 (H) 06/26/2019   Lab Results  Component Value Date   WBC 8.7 06/26/2019   HGB 14.6 06/26/2019   HCT 43.5 06/26/2019   MCV 92 06/26/2019   PLT 328 06/26/2019   Lab Results  Component Value Date   NA 137 06/26/2019   K 4.4 06/26/2019   GLUCOSE 84 06/26/2019   BUN 12 06/26/2019   CREATININE 0.93 06/26/2019   BILITOT 0.4 06/26/2019   ALKPHOS 84 06/26/2019   AST 17 06/26/2019   PROT 7.5 06/26/2019   ALBUMIN 4.6 06/26/2019   CALCIUM 9.9 06/26/2019   Lab Results  Component Value Date   CHOL 230 (H) 06/26/2019   Lab Results  Component Value Date   HDL 68 06/26/2019   Lab Results  Component Value Date   LDLCALC 148 (H) 06/26/2019   Lab Results  Component Value Date   TRIG 82 06/26/2019   Lab Results  Component Value Date   CHOLHDL 3.4 06/26/2019   Lab Results  Component Value Date   HGBA1C 5.4 06/26/2019      Assessment & Plan:   Problem List Items Addressed This Visit    None    Visit Diagnoses    Need for Tdap vaccination    -  Primary   Relevant Orders   Tdap vaccine greater than or equal to 7yo IM (Completed)   Physical exam       Relevant Orders   POCT Urinalysis Dipstick (Completed)   Well woman exam with routine gynecological exam       Relevant Orders   Mammogram Digital Screening   IGP, rfx Aptima HPV ASCU   Healthcare maintenance       Relevant Orders   STD Screen (8)   Ambulatory referral to Gastroenterology   Leukocytes in urine       Relevant Orders   Urine Culture   Heel pain, chronic, left          No orders of the defined types were placed in this encounter.   Follow-up: Return in 3 months (on 10/27/2019) for mychart code thanks.    Vevelyn Francois, NP

## 2019-07-28 LAB — STD SCREEN (8)
HIV Screen 4th Generation wRfx: NONREACTIVE
HSV 1 Glycoprotein G Ab, IgG: 1.92 index — ABNORMAL HIGH (ref 0.00–0.90)
HSV 2 IgG, Type Spec: <0.91 index (ref 0.00–0.90)
Hep A IgM: NEGATIVE
Hep B C IgM: NEGATIVE
Hep C Virus Ab: <0.1 s/co ratio (ref 0.0–0.9)
Hepatitis B Surface Ag: NEGATIVE
RPR Ser Ql: NONREACTIVE

## 2019-07-29 LAB — URINE CULTURE

## 2019-07-30 LAB — IGP, RFX APTIMA HPV ASCU

## 2019-07-31 ENCOUNTER — Other Ambulatory Visit: Payer: Self-pay

## 2019-07-31 ENCOUNTER — Ambulatory Visit
Admission: RE | Admit: 2019-07-31 | Discharge: 2019-07-31 | Disposition: A | Discharge status: Home / Self Care | Payer: 59 | Source: Ambulatory Visit | Attending: Nurse Practitioner | Admitting: Nurse Practitioner

## 2019-07-31 DIAGNOSIS — Z01419 Encounter for gynecological examination (general) (routine) without abnormal findings: Secondary | ICD-10-CM

## 2019-09-04 ENCOUNTER — Encounter: Payer: Self-pay | Admitting: Nurse Practitioner

## 2019-09-25 ENCOUNTER — Other Ambulatory Visit: Payer: Self-pay

## 2019-09-25 ENCOUNTER — Ambulatory Visit (INDEPENDENT_AMBULATORY_CARE_PROVIDER_SITE_OTHER): Payer: 59 | Admitting: Nurse Practitioner

## 2019-09-25 ENCOUNTER — Encounter: Payer: Self-pay | Admitting: Nurse Practitioner

## 2019-09-25 VITALS — BP 131/70 | HR 66 | Temp 97.3°F | Ht 64.0 in | Wt 188.0 lb

## 2019-09-25 DIAGNOSIS — E7800 Pure hypercholesterolemia, unspecified: Secondary | ICD-10-CM | POA: Diagnosis not present

## 2019-09-25 DIAGNOSIS — E039 Hypothyroidism, unspecified: Secondary | ICD-10-CM | POA: Diagnosis not present

## 2019-09-25 NOTE — Progress Notes (Signed)
[facility] Patient [facility] [address] [place], [place]  [address] Phone:  [phone]   Fax:  [phone]   Established Patient Office Visit  Subjective:  Patient ID: Kendra Dodson, female    DOB: 1960/05/20  Age: 59 y.o. MRN: [id number]  CC:  Chief Complaint  Patient presents with  . Follow-up    3 month follow up, no concerns.     HPI Kendra Dodson presents for follow up. She  has a past medical history of Asthma, Fibromyalgia, Hypothyroid, and Migraines.   Hypothyroidism Kendra Dodson is a 59 y.o. female who presents for follow up of hypothyroidism. Current symptoms: none . Patient denies change in energy level, diarrhea, heat / cold intolerance, nervousness and palpitations. Symptoms have stabilized  . Past Medical History:  Diagnosis Date  . Asthma   . Fibromyalgia   . Hypothyroid   . Migraines     Past Surgical History:  Procedure Laterality Date  . ADENOIDECTOMY    . CHOLECYSTECTOMY      Family History  Problem Relation Age of Onset  . Hypertension Mother   . Hypertension Father   . Diabetes Father     Social History   Socioeconomic History  . Marital status: Divorced    Spouse name: Not on file  . Number of children: Not on file  . Years of education: Not on file  . Highest education level: Not on file  Occupational History  . Not on file  Tobacco Use  . Smoking status: Never Smoker  . Smokeless tobacco: Never Used  Vaping Use  . Vaping Use: Never used  Substance and Sexual Activity  . Alcohol use: Yes    Comment: rare  . Drug use: Never  . Sexual activity: Not Currently  Other Topics Concern  . Not on file  Social History Narrative  . Not on file   Social Determinants of Health   Financial Resource Strain:   . Difficulty of Paying Living Expenses:   Food Insecurity:   . Worried About Programme researcher, broadcasting/film/video in the Last Year:   . Barista in the Last Year:   Transportation Needs:   . Freight forwarder (Medical):   Marland Kitchen  Lack of Transportation (Non-Medical):   Physical Activity:   . Days of Exercise per Week:   . Minutes of Exercise per Session:   Stress:   . Feeling of Stress :   Social Connections:   . Frequency of Communication with Friends and Family:   . Frequency of Social Gatherings with Friends and Family:   . Attends Religious Services:   . Active Member of Clubs or Organizations:   . Attends Banker Meetings:   Marland Kitchen Marital Status:   Intimate Partner Violence:   . Fear of Current or Ex-Partner:   . Emotionally Abused:   Marland Kitchen Physically Abused:   . Sexually Abused:     Outpatient Medications Prior to Visit  Medication Sig Dispense Refill  . levothyroxine (SYNTHROID) 25 MCG tablet Take 1 tablet (25 mcg total) by mouth daily before breakfast. 30 tablet 2   No facility-administered medications prior to visit.    No Known Allergies  ROS Review of Systems  All other systems reviewed and are negative.     Objective:    Physical Exam Constitutional:      General: She is not in acute distress.    Appearance: She is obese.  HENT:     Head: Normocephalic  and atraumatic.     Nose: Nose normal.     Mouth/Throat:     Pharynx: Oropharynx is clear.  Cardiovascular:     Rate and Rhythm: Normal rate and regular rhythm.     Pulses: Normal pulses.     Heart sounds: Normal heart sounds.  Pulmonary:     Effort: Pulmonary effort is normal.     Breath sounds: Normal breath sounds.  Abdominal:     General: Bowel sounds are normal.     Palpations: Abdomen is soft.  Musculoskeletal:        General: Normal range of motion.     Cervical back: Normal range of motion.  Skin:    General: Skin is warm and dry.     Capillary Refill: Capillary refill takes less than 2 seconds.  Neurological:     General: No focal deficit present.     Mental Status: She is alert and oriented to person, place, and time.  Psychiatric:        Mood and Affect: Mood normal.        Behavior: Behavior normal.         Thought Content: Thought content normal.        Judgment: Judgment normal.     BP 131/70 (BP Location: Left Arm, Patient Position: Sitting, Cuff Size: Large)   Pulse 66   Temp (!) 97.3 F (36.3 C)   Ht 5\' 4"  (1.626 m)   Wt 188 lb 0.6 oz (85.3 kg)   SpO2 98%   BMI 32.28 kg/m  Wt Readings from Last 3 Encounters:  09/25/19 188 lb 0.6 oz (85.3 kg)  07/27/19 189 lb (85.7 kg)  06/26/19 192 lb 12.8 oz (87.5 kg)     Health Maintenance Due  Topic Date Due  . COLONOSCOPY  Never done    There are no preventive care reminders to display for this patient.  Lab Results  Component Value Date   TSH 10.200 (H) 09/25/2019   Lab Results  Component Value Date   WBC 8.7 06/26/2019   HGB 14.6 06/26/2019   HCT 43.5 06/26/2019   MCV 92 06/26/2019   PLT 328 06/26/2019   Lab Results  Component Value Date   NA 141 09/25/2019   K 4.5 09/25/2019   GLUCOSE 92 09/25/2019   BUN 15 09/25/2019   CREATININE 0.97 09/25/2019   BILITOT 0.5 09/25/2019   ALKPHOS 80 09/25/2019   AST 16 09/25/2019   PROT 7.2 09/25/2019   ALBUMIN 4.5 09/25/2019   CALCIUM 9.6 09/25/2019   Lab Results  Component Value Date   CHOL 221 (H) 09/25/2019   Lab Results  Component Value Date   HDL 64 09/25/2019   Lab Results  Component Value Date   LDLCALC 145 (H) 09/25/2019   Lab Results  Component Value Date   TRIG 66 09/25/2019   Lab Results  Component Value Date   CHOLHDL 3.5 09/25/2019   Lab Results  Component Value Date   HGBA1C 5.4 06/26/2019      Assessment & Plan:   Problem List Items Addressed This Visit    None    Visit Diagnoses    Hypothyroidism, unspecified type    -  Primary   Relevant Orders   Comp. Metabolic Panel (12) (Completed)   TSH (Completed)   T4, free (Completed)   T3 (Completed) We will continue with current regimen until lab results are in   Elevated cholesterol       Relevant Orders  Comp. Metabolic Panel (12) (Completed)   Lipid panel  (Completed) Additional recommendations once labs completed Encourage continued lifestyle modification weight loss, diet monitoring and exercise.  She is doing well Weight loss of 4 pounds over the last 3 months encourage patient to remain slow and steady      No orders of the defined types were placed in this encounter.   Follow-up: No follow-ups on file.    Barbette Merino, NP

## 2019-09-26 LAB — COMP. METABOLIC PANEL (12)
AST: 16 IU/L (ref 0–40)
Albumin/Globulin Ratio: 1.7 (ref 1.2–2.2)
Albumin: 4.5 g/dL (ref 3.8–4.9)
Alkaline Phosphatase: 80 IU/L (ref 48–121)
BUN/Creatinine Ratio: 15 (ref 9–23)
BUN: 15 mg/dL (ref 6–24)
Bilirubin Total: 0.5 mg/dL (ref 0.0–1.2)
Calcium: 9.6 mg/dL (ref 8.7–10.2)
Chloride: 105 mmol/L (ref 96–106)
Creatinine, Ser: 0.97 mg/dL (ref 0.57–1.00)
GFR calc Af Amer: 75 mL/min/{1.73_m2} (ref >59)
GFR calc non Af Amer: 65 mL/min/{1.73_m2} (ref >59)
Globulin, Total: 2.7 g/dL (ref 1.5–4.5)
Glucose: 92 mg/dL (ref 65–99)
Potassium: 4.5 mmol/L (ref 3.5–5.2)
Sodium: 141 mmol/L (ref 134–144)
Total Protein: 7.2 g/dL (ref 6.0–8.5)

## 2019-09-26 LAB — LIPID PANEL
Chol/HDL Ratio: 3.5 ratio (ref 0.0–4.4)
Cholesterol, Total: 221 mg/dL — ABNORMAL HIGH (ref 100–199)
HDL: 64 mg/dL (ref >39)
LDL Chol Calc (NIH): 145 mg/dL — ABNORMAL HIGH (ref 0–99)
Triglycerides: 66 mg/dL (ref 0–149)
VLDL Cholesterol Cal: 12 mg/dL (ref 5–40)

## 2019-09-26 LAB — TSH: TSH: 10.2 u[IU]/mL — ABNORMAL HIGH (ref 0.450–4.500)

## 2019-09-26 LAB — T4, FREE: Free T4: 1.12 ng/dL (ref 0.82–1.77)

## 2019-09-26 LAB — T3: T3, Total: 119 ng/dL (ref 71–180)

## 2019-09-28 ENCOUNTER — Telehealth: Payer: Self-pay

## 2019-09-28 NOTE — Telephone Encounter (Signed)
Called, no answer. Left a message to call back. Thanks!  

## 2019-09-28 NOTE — Telephone Encounter (Signed)
-----   Message from Barbette Merino, NP sent at 09/26/2019  8:16 AM EDT ----- These make Kendra Dodson aware that her labs are stable overall.  Her TSH is still elevated.  I know that she was feeling okay please see if she would like to change the dose of Synthroid. Or we can stay on the same dose and follow-up with lab only in about 6 weeks.Her cholesterol has come down slightly.  I do encourage her to continue to monitor her diet and continue with regular exercise.  Thank you

## 2019-09-29 NOTE — Telephone Encounter (Signed)
Called, no answer. Left a message to call back. Thanks!  

## 2019-09-30 ENCOUNTER — Telehealth: Payer: Self-pay | Admitting: Nurse Practitioner

## 2019-09-30 NOTE — Telephone Encounter (Signed)
See previous message

## 2019-09-30 NOTE — Telephone Encounter (Signed)
Called and spoke with patient, advised that labs over all were stable. Advised that TSH was still elevated and she is requesting a dosage change on this. Can you please send in new rx to pharmacy today? She is completely out of old dosage. Thanks!

## 2019-10-05 ENCOUNTER — Other Ambulatory Visit: Payer: Self-pay | Admitting: Nurse Practitioner

## 2019-10-05 ENCOUNTER — Telehealth: Payer: Self-pay | Admitting: Nurse Practitioner

## 2019-10-05 MED ORDER — LEVOTHYROXINE SODIUM 25 MCG PO TABS: 25.0000 ug | ORAL_TABLET | Freq: Every day | ORAL | 3 refills | Status: DC

## 2019-10-05 NOTE — Telephone Encounter (Signed)
Done

## 2019-10-05 NOTE — Telephone Encounter (Signed)
Sent to Np °

## 2019-10-05 NOTE — Telephone Encounter (Signed)
Refill sent.

## 2019-10-12 ENCOUNTER — Ambulatory Visit: Payer: 59 | Admitting: Nurse Practitioner

## 2019-10-26 ENCOUNTER — Ambulatory Visit: Payer: 59 | Admitting: Nurse Practitioner

## 2019-10-26 ENCOUNTER — Ambulatory Visit (INDEPENDENT_AMBULATORY_CARE_PROVIDER_SITE_OTHER): Payer: 59 | Admitting: Nurse Practitioner

## 2019-10-26 ENCOUNTER — Other Ambulatory Visit: Payer: Self-pay

## 2019-10-26 ENCOUNTER — Encounter: Payer: Self-pay | Admitting: Nurse Practitioner

## 2019-10-26 VITALS — BP 136/79 | HR 48 | Temp 97.8°F | Ht 64.0 in | Wt 187.0 lb

## 2019-10-26 DIAGNOSIS — G4700 Insomnia, unspecified: Secondary | ICD-10-CM | POA: Diagnosis not present

## 2019-10-26 DIAGNOSIS — E039 Hypothyroidism, unspecified: Secondary | ICD-10-CM | POA: Insufficient documentation

## 2019-10-26 DIAGNOSIS — R030 Elevated blood-pressure reading, without diagnosis of hypertension: Secondary | ICD-10-CM

## 2019-10-26 MED ORDER — LEVOTHYROXINE SODIUM 50 MCG PO TABS
50.0000 ug | ORAL_TABLET | Freq: Every day | ORAL | 3 refills | Status: DC
Start: 2019-10-26 — End: 2020-01-18

## 2019-10-26 NOTE — Progress Notes (Signed)
[facility] Patient [facility] [address] [place], [place]  [address] Phone:  [phone]   Fax:  [phone]   Established Patient Office Visit  Subjective:  Patient ID: Kendra Dodson, female    DOB: [birth year]  Age: 59 y.o. MRN: [id number]  CC:  Chief Complaint  Patient presents with   Follow-up    thyroid still currently on 25 mg of Levothyroxine    HPI Kendra Dodson presents for follow up. She  has a past medical history of Asthma, Fibromyalgia, Hypothyroid, and Migraines.   Hypothyroidism She presents for follow up of hypothyroidism. Current symptoms: change in energy level . Patient denies diarrhea, heat / cold intolerance, palpitations and weight changes. Symptoms have gradually worsened.  She has been on Synthroid 25 mg.  Last labs indicate elevated TSH.  Hypertension Patient is here for follow-up of elevated blood pressure.  She does not have a history of hypertension.  She admits that her mother and father have hypertension. She is exercising and is not adherent to a low-salt diet. Blood pressure is not monitored at home. Cardiac symptoms: headaches, mirgraines. Patient denies chest pain, dyspnea, fatigue, irregular heart beat, lower extremity edema, orthopnea and syncope. Cardiovascular risk factors: obesity (BMI >= 30 kg/m2). Use of agents associated with hypertension: none. History of target organ damage: none.   She has insomnia. Onset was several months ago. Patient describes symptoms as frequent night time awakening. Patient has found minimal relief with OTC aide. Associated symptoms include: anxiety, fatigue and stress. Patient denies daytime somnolence, frequent nighttime urination, leg cramps and restless legs. Symptoms have gradually worsened.    Past Medical History:  Diagnosis Date   Asthma    Fibromyalgia    Hypothyroid    Migraines     Past Surgical History:  Procedure Laterality Date   ADENOIDECTOMY     CHOLECYSTECTOMY      Family History    Problem Relation Age of Onset   Hypertension Mother    Hypertension Father    Diabetes Father     Social History   Socioeconomic History   Marital status: Divorced    Spouse name: Not on file   Number of children: Not on file   Years of education: Not on file   Highest education level: Not on file  Occupational History   Not on file  Tobacco Use   Smoking status: Never Smoker   Smokeless tobacco: Never Used  Vaping Use   Vaping Use: Never used  Substance and Sexual Activity   Alcohol use: Yes    Comment: rare   Drug use: Never   Sexual activity: Not Currently  Other Topics Concern   Not on file  Social History Narrative   Not on file   Social Determinants of Health   Financial Resource Strain:    Difficulty of Paying Living Expenses:   Food Insecurity:    Worried About Programme researcher, broadcasting/film/video in the Last Year:    Barista in the Last Year:   Transportation Needs:    Freight forwarder (Medical):    Lack of Transportation (Non-Medical):   Physical Activity:    Days of Exercise per Week:    Minutes of Exercise per Session:   Stress:    Feeling of Stress :   Social Connections:    Frequency of Communication with Friends and Family:    Frequency of Social Gatherings with Friends and Family:    Attends Religious Services:  Active Member of Clubs or Organizations:    Attends Engineer, structural:    Marital Status:   Intimate Partner Violence:    Fear of Current or Ex-Partner:    Emotionally Abused:    Physically Abused:    Sexually Abused:     Outpatient Medications Prior to Visit  Medication Sig Dispense Refill   levothyroxine (SYNTHROID) 25 MCG tablet Take 1 tablet (25 mcg total) by mouth daily before breakfast. 90 tablet 3   No facility-administered medications prior to visit.    No Known Allergies  ROS Review of Systems  All other systems reviewed and are negative.     Objective:     Physical Exam Constitutional:      General: She is not in acute distress.    Appearance: She is not toxic-appearing.  HENT:     Head: Normocephalic and atraumatic.     Nose: Nose normal.     Mouth/Throat:     Mouth: Mucous membranes are moist.     Pharynx: Oropharynx is clear.  Eyes:     Pupils: Pupils are equal, round, and reactive to light.  Cardiovascular:     Rate and Rhythm: Normal rate and regular rhythm.     Pulses: Normal pulses.     Heart sounds: Normal heart sounds.  Pulmonary:     Effort: Pulmonary effort is normal.     Breath sounds: Normal breath sounds.  Abdominal:     General: Bowel sounds are normal.     Palpations: Abdomen is soft.  Musculoskeletal:        General: Normal range of motion.     Cervical back: Normal range of motion.  Skin:    General: Skin is warm and dry.     Capillary Refill: Capillary refill takes less than 2 seconds.  Neurological:     Mental Status: She is alert and oriented to person, place, and time.  Psychiatric:        Mood and Affect: Mood normal.        Behavior: Behavior normal.        Thought Content: Thought content normal.        Judgment: Judgment normal.     BP 136/79    Pulse (!) 48    Temp 97.8 F (36.6 C)    Ht 5\' 4"  (1.626 m)    Wt 187 lb (84.8 kg)    SpO2 100%    BMI 32.10 kg/m  Wt Readings from Last 3 Encounters:  10/26/19 187 lb (84.8 kg)  09/25/19 188 lb 0.6 oz (85.3 kg)  07/27/19 189 lb (85.7 kg)     Health Maintenance Due  Topic Date Due   COLONOSCOPY  Never done   INFLUENZA VACCINE  10/11/2019    There are no preventive care reminders to display for this patient.  Lab Results  Component Value Date   TSH 10.200 (H) 09/25/2019   Lab Results  Component Value Date   WBC 8.7 06/26/2019   HGB 14.6 06/26/2019   HCT 43.5 06/26/2019   MCV 92 06/26/2019   PLT 328 06/26/2019   Lab Results  Component Value Date   NA 141 09/25/2019   K 4.5 09/25/2019   GLUCOSE 92 09/25/2019   BUN 15 09/25/2019    CREATININE 0.97 09/25/2019   BILITOT 0.5 09/25/2019   ALKPHOS 80 09/25/2019   AST 16 09/25/2019   PROT 7.2 09/25/2019   ALBUMIN 4.5 09/25/2019   CALCIUM 9.6 09/25/2019   Lab  Results  Component Value Date   CHOL 221 (H) 09/25/2019   Lab Results  Component Value Date   HDL 64 09/25/2019   Lab Results  Component Value Date   LDLCALC 145 (H) 09/25/2019   Lab Results  Component Value Date   TRIG 66 09/25/2019   Lab Results  Component Value Date   CHOLHDL 3.5 09/25/2019   Lab Results  Component Value Date   HGBA1C 5.4 06/26/2019      Assessment & Plan:   Problem List Items Addressed This Visit      Endocrine   Hypothyroidism - Primary   Relevant Medications   levothyroxine (SYNTHROID) 50 MCG tablet   Other Relevant Orders   TSH   T4, free   T3    Other Visit Diagnoses    Elevated BP without diagnosis of hypertension     Encouraged home monitoring and recording BP <130/80 Eating a heart-healthy diet with less salt Encouraged regular physical activity  Recommend Weight loss      Insomnia, unspecified type     Discussed sleep hygiene measures including regular sleep schedule, optimal sleep environment, and relaxing presleep rituals. Avoid daytime naps. Avoid caffeine after noon. Avoid excess alcohol. Avoid tobacco. Recommended daily exercise.       Meds ordered this encounter  Medications   levothyroxine (SYNTHROID) 50 MCG tablet    Sig: Take 1 tablet (50 mcg total) by mouth daily.    Dispense:  90 tablet    Refill:  3    Order Specific Question:   Supervising Provider    Answer:   Quentin Angst [id number]    Follow-up: Return in about 3 months (around 01/26/2020) for lab only in 6 weeks TSH and then her 3 mon fu.    Barbette Merino, NP

## 2019-10-26 NOTE — Patient Instructions (Addendum)
Stress, Adult Stress is a normal reaction to life events. Stress is what you feel when life demands more than you are used to, or more than you think you can handle. Some stress can be useful, such as studying for a test or meeting a deadline at work. Stress that occurs too often or for too long can cause problems. It can affect your emotional health and interfere with relationships and normal daily activities. Too much stress can weaken your body's defense system (immune system) and increase your risk for physical illness. If you already have a medical problem, stress can make it worse. What are the causes? All sorts of life events can cause stress. An event that causes stress for one person may not be stressful for another person. Major life events, whether positive or negative, commonly cause stress. Examples include:  Losing a job or starting a new job.  Losing a loved one.  Moving to a new town or home.  Getting married or divorced.  Having a baby.  Getting injured or sick. Less obvious life events can also cause stress, especially if they occur day after day or in combination with each other. Examples include:  Working long hours.  Driving in traffic.  Caring for children.  Being in debt.  Being in a difficult relationship. What are the signs or symptoms? Stress can cause emotional symptoms, including:  Anxiety. This is feeling worried, afraid, on edge, overwhelmed, or out of control.  Anger, including irritation or impatience.  Depression. This is feeling sad, down, helpless, or guilty.  Trouble focusing, remembering, or making decisions. Stress can cause physical symptoms, including:  Aches and pains. These may affect your head, neck, back, stomach, or other areas of your body.  Tight muscles or a clenched jaw.  Low energy.  Trouble sleeping. Stress can cause unhealthy behaviors, including:  Eating to feel better (overeating) or skipping meals.  Working too  much or putting off tasks.  Smoking, drinking alcohol, or using drugs to feel better. How is this diagnosed? Stress is diagnosed through an assessment by your health care provider. He or she may diagnose this condition based on:  Your symptoms and any stressful life events.  Your medical history.  Tests to rule out other causes of your symptoms. Depending on your condition, your health care provider may refer you to a specialist for further evaluation. How is this treated?  Stress management techniques are the recommended treatment for stress. Medicine is not typically recommended for the treatment of stress. Techniques to reduce your reaction to stressful life events include:  Stress identification. Monitor yourself for symptoms of stress and identify what causes stress for you. These skills may help you to avoid or prepare for stressful events.  Time management. Set your priorities, keep a calendar of events, and learn to say no. Taking these actions can help you avoid making too many commitments. Techniques for coping with stress include:  Rethinking the problem. Try to think realistically about stressful events rather than ignoring them or overreacting. Try to find the positives in a stressful situation rather than focusing on the negatives.  Exercise. Physical exercise can release both physical and emotional tension. The key is to find a form of exercise that you enjoy and do it regularly.  Relaxation techniques. These relax the body and mind. The key is to find one or more that you enjoy and use the techniques regularly. Examples include: ? Meditation, deep breathing, or progressive relaxation techniques. ? Yoga or   tai chi. ? Biofeedback, mindfulness techniques, or journaling. ? Listening to music, being out in nature, or participating in other hobbies.  Practicing a healthy lifestyle. Eat a balanced diet, drink plenty of water, limit or avoid caffeine, and get plenty of  sleep.  Having a strong support network. Spend time with family, friends, or other people you enjoy being around. Express your feelings and talk things over with someone you trust. Counseling or talk therapy with a mental health professional may be helpful if you are having trouble managing stress on your own. Follow these instructions at home: Lifestyle   Avoid drugs.  Do not use any products that contain nicotine or tobacco, such as cigarettes, e-cigarettes, and chewing tobacco. If you need help quitting, ask your health care provider.  Limit alcohol intake to no more than 1 drink a day for nonpregnant women and 2 drinks a day for men. One drink equals 12 oz of beer, 5 oz of wine, or 1 oz of hard liquor  Do not use alcohol or drugs to relax.  Eat a balanced diet that includes fresh fruits and vegetables, whole grains, lean meats, fish, eggs, and beans, and low-fat dairy. Avoid processed foods and foods high in added fat, sugar, and salt.  Exercise at least 30 minutes on 5 or more days each week.  Get 7-8 hours of sleep each night. General instructions   Practice stress management techniques as discussed with your health care provider.  Drink enough fluid to keep your urine clear or pale yellow.  Take over-the-counter and prescription medicines only as told by your health care provider.  Keep all follow-up visits as told by your health care provider. This is important. Contact a health care provider if:  Your symptoms get worse.  You have new symptoms.  You feel overwhelmed by your problems and can no longer manage them on your own. Get help right away if:  You have thoughts of hurting yourself or others. If you ever feel like you may hurt yourself or others, or have thoughts about taking your own life, get help right away. You can go to your nearest emergency department or call:  Your local emergency services (911 in the [place]  A suicide crisis helpline, such as the  National Suicide Prevention Lifeline at 518-452-9398. This is open 24 hours a day. Summary  Stress is a normal reaction to life events. It can cause problems if it happens too often or for too long.  Practicing stress management techniques is the best way to treat stress.  Counseling or talk therapy with a mental health professional may be helpful if you are having trouble managing stress on your own. This information is not intended to replace advice given to you by your health care provider. Make sure you discuss any questions you have with your health care provider. Document Revised: 09/26/2018 Document Reviewed: 04/18/2016 Elsevier Patient Education  2020 [employer].  Managing Your Hypertension Hypertension is commonly called high blood pressure. This is when the force of your blood pressing against the walls of your arteries is too strong. Arteries are blood vessels that carry blood from your heart throughout your body. Hypertension forces the heart to work harder to pump blood, and may cause the arteries to become narrow or stiff. Having untreated or uncontrolled hypertension can cause heart attack, stroke, kidney disease, and other problems. What are blood pressure readings? A blood pressure reading consists of a higher number over a lower number. Ideally, your blood  pressure should be below 120/80. The first ("top") number is called the systolic pressure. It is a measure of the pressure in your arteries as your heart beats. The second ("bottom") number is called the diastolic pressure. It is a measure of the pressure in your arteries as the heart relaxes. What does my blood pressure reading mean? Blood pressure is classified into four stages. Based on your blood pressure reading, your health care provider may use the following stages to determine what type of treatment you need, if any. Systolic pressure and diastolic pressure are measured in a unit called mm Hg. Normal  Systolic  pressure: below 120.  Diastolic pressure: below 80. Elevated  Systolic pressure: 092-330.  Diastolic pressure: below 80. Hypertension stage 1  Systolic pressure: 076-226.  Diastolic pressure: 33-35. Hypertension stage 2  Systolic pressure: 456 or above.  Diastolic pressure: 90 or above. What health risks are associated with hypertension? Managing your hypertension is an important responsibility. Uncontrolled hypertension can lead to:  A heart attack.  A stroke.  A weakened blood vessel (aneurysm).  Heart failure.  Kidney damage.  Eye damage.  Metabolic syndrome.  Memory and concentration problems. What changes can I make to manage my hypertension? Hypertension can be managed by making lifestyle changes and possibly by taking medicines. Your health care provider will help you make a plan to bring your blood pressure within a normal range. Eating and drinking   Eat a diet that is high in fiber and potassium, and low in salt (sodium), added sugar, and fat. An example eating plan is called the DASH (Dietary Approaches to Stop Hypertension) diet. To eat this way: ? Eat plenty of fresh fruits and vegetables. Try to fill half of your plate at each meal with fruits and vegetables. ? Eat whole grains, such as whole wheat pasta, brown rice, or whole grain bread. Fill about one quarter of your plate with whole grains. ? Eat low-fat diary products. ? Avoid fatty cuts of meat, processed or cured meats, and poultry with skin. Fill about one quarter of your plate with lean proteins such as fish, chicken without skin, beans, eggs, and tofu. ? Avoid premade and processed foods. These tend to be higher in sodium, added sugar, and fat.  Reduce your daily sodium intake. Most people with hypertension should eat less than 1,500 mg of sodium a day.  Limit alcohol intake to no more than 1 drink a day for nonpregnant women and 2 drinks a day for men. One drink equals 12 oz of beer, 5 oz of  wine, or 1 oz of hard liquor. Lifestyle  Work with your health care provider to maintain a healthy body weight, or to lose weight. Ask what an ideal weight is for you.  Get at least 30 minutes of exercise that causes your heart to beat faster (aerobic exercise) most days of the week. Activities may include walking, swimming, or biking.  Include exercise to strengthen your muscles (resistance exercise), such as weight lifting, as part of your weekly exercise routine. Try to do these types of exercises for 30 minutes at least 3 days a week.  Do not use any products that contain nicotine or tobacco, such as cigarettes and e-cigarettes. If you need help quitting, ask your health care provider.  Control any long-term (chronic) conditions you have, such as high cholesterol or diabetes. Monitoring  Monitor your blood pressure at home as told by your health care provider. Your personal target blood pressure may vary depending  on your medical conditions, your age, and other factors.  Have your blood pressure checked regularly, as often as told by your health care provider. Working with your health care provider  Review all the medicines you take with your health care provider because there may be side effects or interactions.  Talk with your health care provider about your diet, exercise habits, and other lifestyle factors that may be contributing to hypertension.  Visit your health care provider regularly. Your health care provider can help you create and adjust your plan for managing hypertension. Will I need medicine to control my blood pressure? Your health care provider may prescribe medicine if lifestyle changes are not enough to get your blood pressure under control, and if:  Your systolic blood pressure is 130 or higher.  Your diastolic blood pressure is 80 or higher. Take medicines only as told by your health care provider. Follow the directions carefully. Blood pressure medicines must  be taken as prescribed. The medicine does not work as well when you skip doses. Skipping doses also puts you at risk for problems. Contact a health care provider if:  You think you are having a reaction to medicines you have taken.  You have repeated (recurrent) headaches.  You feel dizzy.  You have swelling in your ankles.  You have trouble with your vision. Get help right away if:  You develop a severe headache or confusion.  You have unusual weakness or numbness, or you feel faint.  You have severe pain in your chest or abdomen.  You vomit repeatedly.  You have trouble breathing. Summary  Hypertension is when the force of blood pumping through your arteries is too strong. If this condition is not controlled, it may put you at risk for serious complications.  Your personal target blood pressure may vary depending on your medical conditions, your age, and other factors. For most people, a normal blood pressure is less than 120/80.  Hypertension is managed by lifestyle changes, medicines, or both. Lifestyle changes include weight loss, eating a healthy, low-sodium diet, exercising more, and limiting alcohol. This information is not intended to replace advice given to you by your health care provider. Make sure you discuss any questions you have with your health care provider. Document Revised: 06/20/2018 Document Reviewed: 01/25/2016 Elsevier Patient Education  [place].

## 2019-10-27 ENCOUNTER — Encounter: Payer: Self-pay | Admitting: Nurse Practitioner

## 2020-01-14 ENCOUNTER — Other Ambulatory Visit: Payer: Self-pay

## 2020-01-14 ENCOUNTER — Ambulatory Visit (INDEPENDENT_AMBULATORY_CARE_PROVIDER_SITE_OTHER): Payer: 59 | Admitting: Nurse Practitioner

## 2020-01-14 ENCOUNTER — Encounter: Payer: Self-pay | Admitting: Nurse Practitioner

## 2020-01-14 VITALS — BP 151/94 | HR 81 | Temp 98.2°F | Ht 63.0 in | Wt 180.2 lb

## 2020-01-14 DIAGNOSIS — I158 Other secondary hypertension: Secondary | ICD-10-CM | POA: Diagnosis not present

## 2020-01-14 DIAGNOSIS — E039 Hypothyroidism, unspecified: Secondary | ICD-10-CM

## 2020-01-14 MED ORDER — LISINOPRIL 5 MG PO TABS: 5.0000 mg | ORAL_TABLET | Freq: Every day | ORAL | 11 refills | Status: DC

## 2020-01-14 NOTE — Patient Instructions (Signed)
   Managing Your Hypertension Hypertension is commonly called high blood pressure. This is when the force of your blood pressing against the walls of your arteries is too strong. Arteries are blood vessels that carry blood from your heart throughout your body. Hypertension forces the heart to work harder to pump blood, and may cause the arteries to become narrow or stiff. Having untreated or uncontrolled hypertension can cause heart attack, stroke, kidney disease, and other problems. What are blood pressure readings? A blood pressure reading consists of a higher number over a lower number. Ideally, your blood pressure should be below 120/80. The first ("top") number is called the systolic pressure. It is a measure of the pressure in your arteries as your heart beats. The second ("bottom") number is called the diastolic pressure. It is a measure of the pressure in your arteries as the heart relaxes. What does my blood pressure reading mean? Blood pressure is classified into four stages. Based on your blood pressure reading, your health care provider may use the following stages to determine what type of treatment you need, if any. Systolic pressure and diastolic pressure are measured in a unit called mm Hg. Normal  Systolic pressure: below 120.  Diastolic pressure: below 80. Elevated  Systolic pressure: 120-129.  Diastolic pressure: below 80. Hypertension stage 1  Systolic pressure: 130-139.  Diastolic pressure: 80-89. Hypertension stage 2  Systolic pressure: 140 or above.  Diastolic pressure: 90 or above. What health risks are associated with hypertension? Managing your hypertension is an important responsibility. Uncontrolled hypertension can lead to:  A heart attack.  A stroke.  A weakened blood vessel (aneurysm).  Heart failure.  Kidney damage.  Eye damage.  Metabolic syndrome.  Memory and concentration problems. What changes can I make to manage my  hypertension? Hypertension can be managed by making lifestyle changes and possibly by taking medicines. Your health care provider will help you make a plan to bring your blood pressure within a normal range. Eating and drinking   Eat a diet that is high in fiber and potassium, and low in salt (sodium), added sugar, and fat. An example eating plan is called the DASH (Dietary Approaches to Stop Hypertension) diet. To eat this way: ? Eat plenty of fresh fruits and vegetables. Try to fill half of your plate at each meal with fruits and vegetables. ? Eat whole grains, such as whole wheat pasta, brown rice, or whole grain bread. Fill about one quarter of your plate with whole grains. ? Eat low-fat diary products. ? Avoid fatty cuts of meat, processed or cured meats, and poultry with skin. Fill about one quarter of your plate with lean proteins such as fish, chicken without skin, beans, eggs, and tofu. ? Avoid premade and processed foods. These tend to be higher in sodium, added sugar, and fat.  Reduce your daily sodium intake. Most people with hypertension should eat less than 1,500 mg of sodium a day.  Limit alcohol intake to no more than 1 drink a day for nonpregnant women and 2 drinks a day for men. One drink equals 12 oz of beer, 5 oz of wine, or 1 oz of hard liquor. Lifestyle  Work with your health care provider to maintain a healthy body weight, or to lose weight. Ask what an ideal weight is for you.  Get at least 30 minutes of exercise that causes your heart to beat faster (aerobic exercise) most days of the week. Activities may include walking, swimming, or biking.    Include exercise to strengthen your muscles (resistance exercise), such as weight lifting, as part of your weekly exercise routine. Try to do these types of exercises for 30 minutes at least 3 days a week.  Do not use any products that contain nicotine or tobacco, such as cigarettes and e-cigarettes. If you need help quitting,  ask your health care provider.  Control any long-term (chronic) conditions you have, such as high cholesterol or diabetes. Monitoring  Monitor your blood pressure at home as told by your health care provider. Your personal target blood pressure may vary depending on your medical conditions, your age, and other factors.  Have your blood pressure checked regularly, as often as told by your health care provider. Working with your health care provider  Review all the medicines you take with your health care provider because there may be side effects or interactions.  Talk with your health care provider about your diet, exercise habits, and other lifestyle factors that may be contributing to hypertension.  Visit your health care provider regularly. Your health care provider can help you create and adjust your plan for managing hypertension. Will I need medicine to control my blood pressure? Your health care provider may prescribe medicine if lifestyle changes are not enough to get your blood pressure under control, and if:  Your systolic blood pressure is 130 or higher.  Your diastolic blood pressure is 80 or higher. Take medicines only as told by your health care provider. Follow the directions carefully. Blood pressure medicines must be taken as prescribed. The medicine does not work as well when you skip doses. Skipping doses also puts you at risk for problems. Contact a health care provider if:  You think you are having a reaction to medicines you have taken.  You have repeated (recurrent) headaches.  You feel dizzy.  You have swelling in your ankles.  You have trouble with your vision. Get help right away if:  You develop a severe headache or confusion.  You have unusual weakness or numbness, or you feel faint.  You have severe pain in your chest or abdomen.  You vomit repeatedly.  You have trouble breathing. Summary  Hypertension is when the force of blood pumping  through your arteries is too strong. If this condition is not controlled, it may put you at risk for serious complications.  Your personal target blood pressure may vary depending on your medical conditions, your age, and other factors. For most people, a normal blood pressure is less than 120/80.  Hypertension is managed by lifestyle changes, medicines, or both. Lifestyle changes include weight loss, eating a healthy, low-sodium diet, exercising more, and limiting alcohol. This information is not intended to replace advice given to you by your health care provider. Make sure you discuss any questions you have with your health care provider. Document Revised: 06/20/2018 Document Reviewed: 01/25/2016 Elsevier Patient Education  2020 [employer]  

## 2020-01-14 NOTE — Progress Notes (Signed)
[facility] Patient [facility] [address] [place], [place]  [address] Phone:  [phone]   Fax:  [phone]   Established Patient Office Visit  Subjective:  Patient ID: Kendra Dodson, female    DOB: February 03, 1961  Age: 59 y.o. MRN: [id number]  CC:  Chief Complaint  Patient presents with   Follow-up    HPI Kendra Dodson presents for follow up. She  has a past medical history of Asthma, Fibromyalgia, Hypothyroid, and Migraines.   Hypertension Patient is here for follow-up of elevated blood pressure. She is exercising and is adherent to a low-salt diet. Blood pressure is not well controlled at home. Cardiac symptoms: none. Patient denies chest pain, dyspnea, fatigue, irregular heart beat, lower extremity edema, palpitations and syncope. Cardiovascular risk factors: obesity (BMI >= 30 kg/m2). Use of agents associated with hypertension: none. History of target organ damage: none.  She admits that she continues under increased stress.  She is concerned that this is why her blood pressure remains elevated.  She has tried numerous different things including yoga, breathing exercises and she speaks with a therapist weekly.   Past Medical History:  Diagnosis Date   Asthma    Fibromyalgia    Hypothyroid    Migraines     Past Surgical History:  Procedure Laterality Date   ADENOIDECTOMY     CHOLECYSTECTOMY      Family History  Problem Relation Age of Onset   Hypertension Mother    Hypertension Father    Diabetes Father     Social History   Socioeconomic History   Marital status: Divorced    Spouse name: Not on file   Number of children: Not on file   Years of education: Not on file   Highest education level: Not on file  Occupational History   Not on file  Tobacco Use   Smoking status: Never Smoker   Smokeless tobacco: Never Used  Vaping Use   Vaping Use: Never used  Substance and Sexual Activity   Alcohol use: Yes    Comment: rare   Drug use:  Never   Sexual activity: Not Currently  Other Topics Concern   Not on file  Social History Narrative   Not on file   Social Determinants of Health   Financial Resource Strain:    Difficulty of Paying Living Expenses: Not on file  Food Insecurity:    Worried About Running Out of Food in the Last Year: Not on file   Ran Out of Food in the Last Year: Not on file  Transportation Needs:    Lack of Transportation (Medical): Not on file   Lack of Transportation (Non-Medical): Not on file  Physical Activity:    Days of Exercise per Week: Not on file   Minutes of Exercise per Session: Not on file  Stress:    Feeling of Stress : Not on file  Social Connections:    Frequency of Communication with Friends and Family: Not on file   Frequency of Social Gatherings with Friends and Family: Not on file   Attends Religious Services: Not on file   Active Member of Clubs or Organizations: Not on file   Attends Banker Meetings: Not on file   Marital Status: Not on file  Intimate Partner Violence:    Fear of Current or Ex-Partner: Not on file   Emotionally Abused: Not on file   Physically Abused: Not on file   Sexually Abused: Not on file  Outpatient Medications Prior to Visit  Medication Sig Dispense Refill   levothyroxine (SYNTHROID) 50 MCG tablet Take 1 tablet (50 mcg total) by mouth daily. 90 tablet 3   No facility-administered medications prior to visit.    No Known Allergies  ROS Review of Systems  Eyes: Positive for visual disturbance (migriane).  Respiratory: Negative for shortness of breath.   Cardiovascular:       Chest discomfort but feels like it is related to the Yoga.   Neurological: Negative for dizziness, syncope and headaches.      Objective:    Physical Exam HENT:     Head: Normocephalic and atraumatic.     Nose: Nose normal.     Mouth/Throat:     Mouth: Mucous membranes are moist.  Cardiovascular:     Rate and Rhythm:  Normal rate and regular rhythm.     Pulses: Normal pulses.     Heart sounds: Normal heart sounds.  Musculoskeletal:     Cervical back: Normal range of motion.     Right lower leg: No edema.     Left lower leg: No edema.  Skin:    General: Skin is warm and dry.     Capillary Refill: Capillary refill takes less than 2 seconds.  Neurological:     General: No focal deficit present.     Mental Status: She is alert and oriented to person, place, and time.  Psychiatric:        Mood and Affect: Mood normal.        Behavior: Behavior normal.        Thought Content: Thought content normal.        Judgment: Judgment normal.     BP (!) 151/94 (BP Location: Left Arm, Patient Position: Sitting, Cuff Size: Large)    Pulse 81    Temp 98.2 F (36.8 C)    Ht 5\' 3"  (1.6 m)    Wt 180 lb 3.2 oz (81.7 kg)    SpO2 98%    BMI 31.92 kg/m  Wt Readings from Last 3 Encounters:  01/14/20 180 lb 3.2 oz (81.7 kg)  10/26/19 187 lb (84.8 kg)  09/25/19 188 lb 0.6 oz (85.3 kg)     Health Maintenance Due  Topic Date Due   COLONOSCOPY  Never done    There are no preventive care reminders to display for this patient.  Lab Results  Component Value Date   TSH 10.200 (H) 09/25/2019   Lab Results  Component Value Date   WBC 8.7 06/26/2019   HGB 14.6 06/26/2019   HCT 43.5 06/26/2019   MCV 92 06/26/2019   PLT 328 06/26/2019   Lab Results  Component Value Date   NA 141 09/25/2019   K 4.5 09/25/2019   GLUCOSE 92 09/25/2019   BUN 15 09/25/2019   CREATININE 0.97 09/25/2019   BILITOT 0.5 09/25/2019   ALKPHOS 80 09/25/2019   AST 16 09/25/2019   PROT 7.2 09/25/2019   ALBUMIN 4.5 09/25/2019   CALCIUM 9.6 09/25/2019   Lab Results  Component Value Date   CHOL 221 (H) 09/25/2019   Lab Results  Component Value Date   HDL 64 09/25/2019   Lab Results  Component Value Date   LDLCALC 145 (H) 09/25/2019   Lab Results  Component Value Date   TRIG 66 09/25/2019   Lab Results  Component Value Date     CHOLHDL 3.5 09/25/2019   Lab Results  Component Value Date   HGBA1C 5.4 06/26/2019  Assessment & Plan:   Problem List Items Addressed This Visit      Endocrine   Hypothyroidism    Other Visit Diagnoses    Other secondary hypertension    -  Primary Encourage patient to continue to do with the stress and if this improves and blood pressure goes back into her normal range and there is possible consideration of not having to use medications however for now started lisinopril 5 mg. Encouraged home monitoring and recording BP <130/80, if she noticed the blood pressures are getting too low and causing her to feel lightheaded and dizzy to call me and make me aware we may adjust the dose by cutting the prescription in half.  Patient verbalized understanding Encourage patient to continue with eating a heart-healthy diet with less salt along with encouraged regular physical activity and weight loss      Stress   Relevant Medications   lisinopril (ZESTRIL) 5 MG tablet   Other Relevant Orders   Comp. Metabolic Panel (12)      Meds ordered this encounter  Medications   lisinopril (ZESTRIL) 5 MG tablet    Sig: Take 1 tablet (5 mg total) by mouth daily.    Dispense:  30 tablet    Refill:  11    Order Specific Question:   Supervising Provider    Answer:   Quentin Angst [[id number]]    Follow-up: Return in about 4 weeks (around 02/11/2020).    Barbette Merino, NP

## 2020-01-15 LAB — T3: T3, Total: 94 ng/dL (ref 71–180)

## 2020-01-15 LAB — COMP. METABOLIC PANEL (12)
AST: 31 IU/L (ref 0–40)
Albumin/Globulin Ratio: 1.7 (ref 1.2–2.2)
Albumin: 4.7 g/dL (ref 3.8–4.9)
Alkaline Phosphatase: 70 IU/L (ref 44–121)
BUN/Creatinine Ratio: 14 (ref 9–23)
BUN: 14 mg/dL (ref 6–24)
Bilirubin Total: 0.5 mg/dL (ref 0.0–1.2)
Calcium: 10 mg/dL (ref 8.7–10.2)
Chloride: 100 mmol/L (ref 96–106)
Creatinine, Ser: 0.97 mg/dL (ref 0.57–1.00)
GFR calc Af Amer: 75 mL/min/{1.73_m2} (ref >59)
GFR calc non Af Amer: 65 mL/min/{1.73_m2} (ref >59)
Globulin, Total: 2.7 g/dL (ref 1.5–4.5)
Glucose: 92 mg/dL (ref 65–99)
Potassium: 4.4 mmol/L (ref 3.5–5.2)
Sodium: 137 mmol/L (ref 134–144)
Total Protein: 7.4 g/dL (ref 6.0–8.5)

## 2020-01-15 LAB — TSH: TSH: 6.51 u[IU]/mL — ABNORMAL HIGH (ref 0.450–4.500)

## 2020-01-15 LAB — T4, FREE: Free T4: 1.32 ng/dL (ref 0.82–1.77)

## 2020-01-18 ENCOUNTER — Other Ambulatory Visit: Payer: Self-pay | Admitting: Nurse Practitioner

## 2020-01-18 MED ORDER — LEVOTHYROXINE SODIUM 75 MCG PO TABS
75.0000 ug | ORAL_TABLET | Freq: Every day | ORAL | 11 refills | Status: DC
Start: 2020-01-18 — End: 2021-01-23

## 2020-01-26 ENCOUNTER — Ambulatory Visit: Payer: 59

## 2020-01-29 ENCOUNTER — Telehealth: Payer: Self-pay | Admitting: Nurse Practitioner

## 2020-01-29 NOTE — Telephone Encounter (Signed)
Pt was called about awv w/ pcc. lvm to return call  

## 2020-02-25 ENCOUNTER — Ambulatory Visit: Payer: 59 | Admitting: Nurse Practitioner

## 2020-09-07 IMAGING — MG DIGITAL SCREENING BILAT W/ CAD
4 series · 4 of 4 positions shown · non-contrast
Comparison: Previous exam(s).

CLINICAL DATA: Screening.

EXAM:
DIGITAL SCREENING BILATERAL MAMMOGRAM WITH CAD

[R CC]
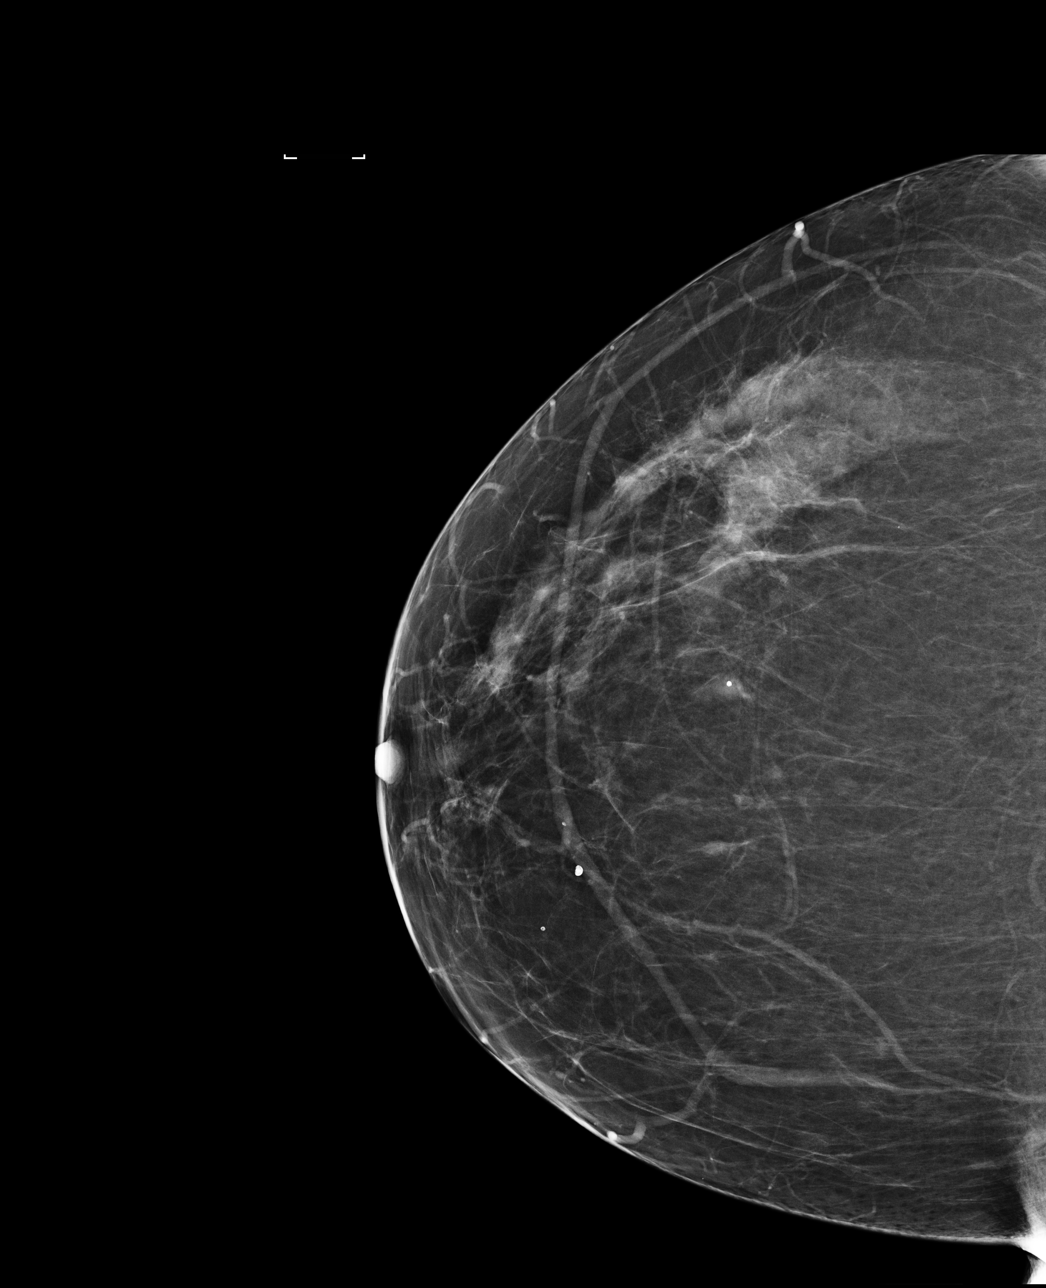

[L MLO]
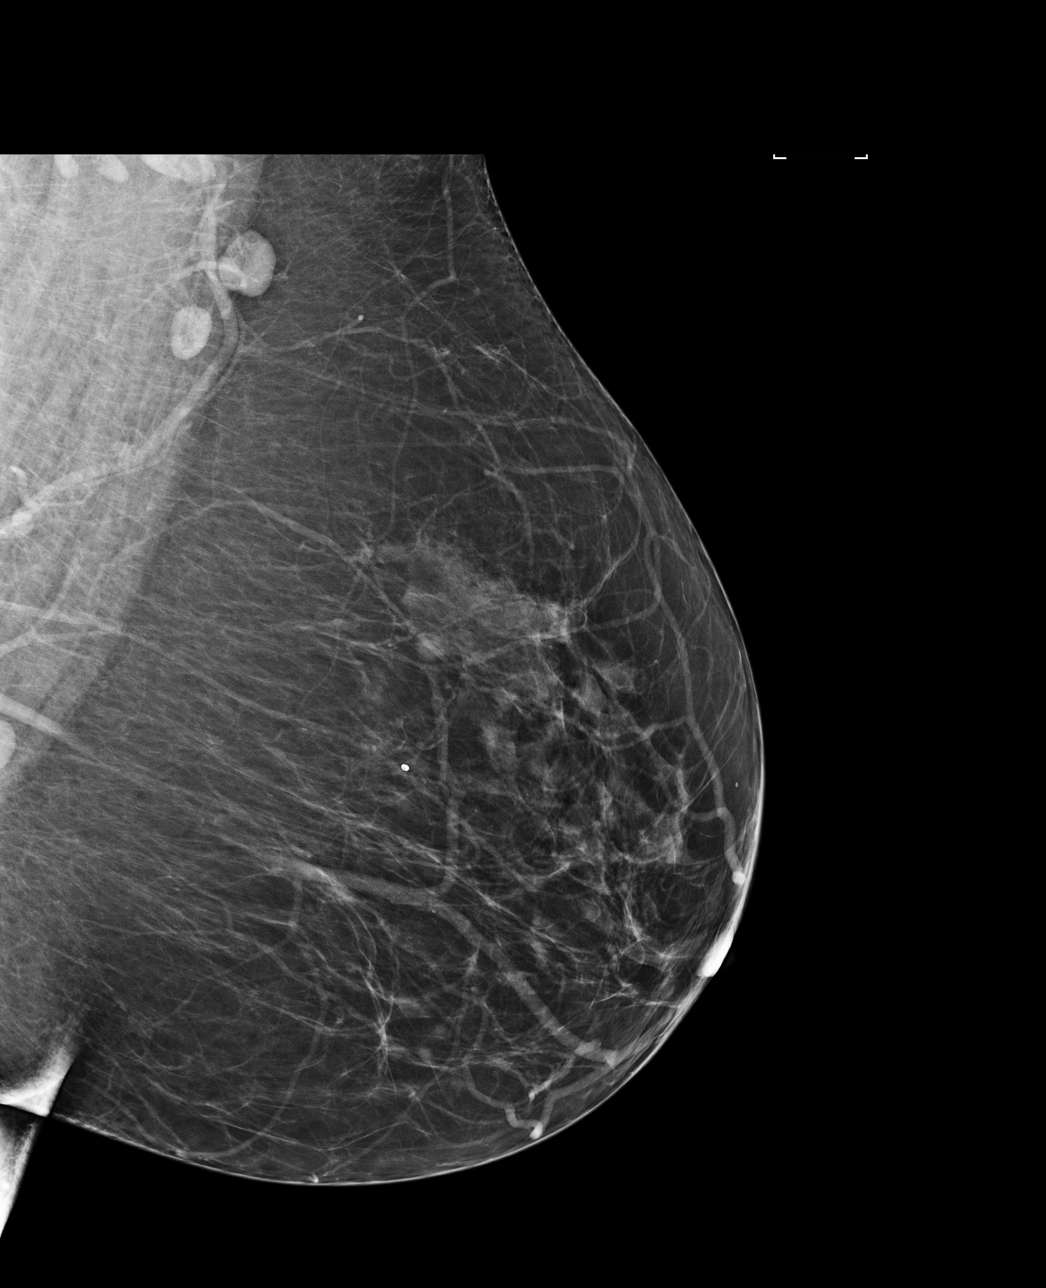

[L CC]
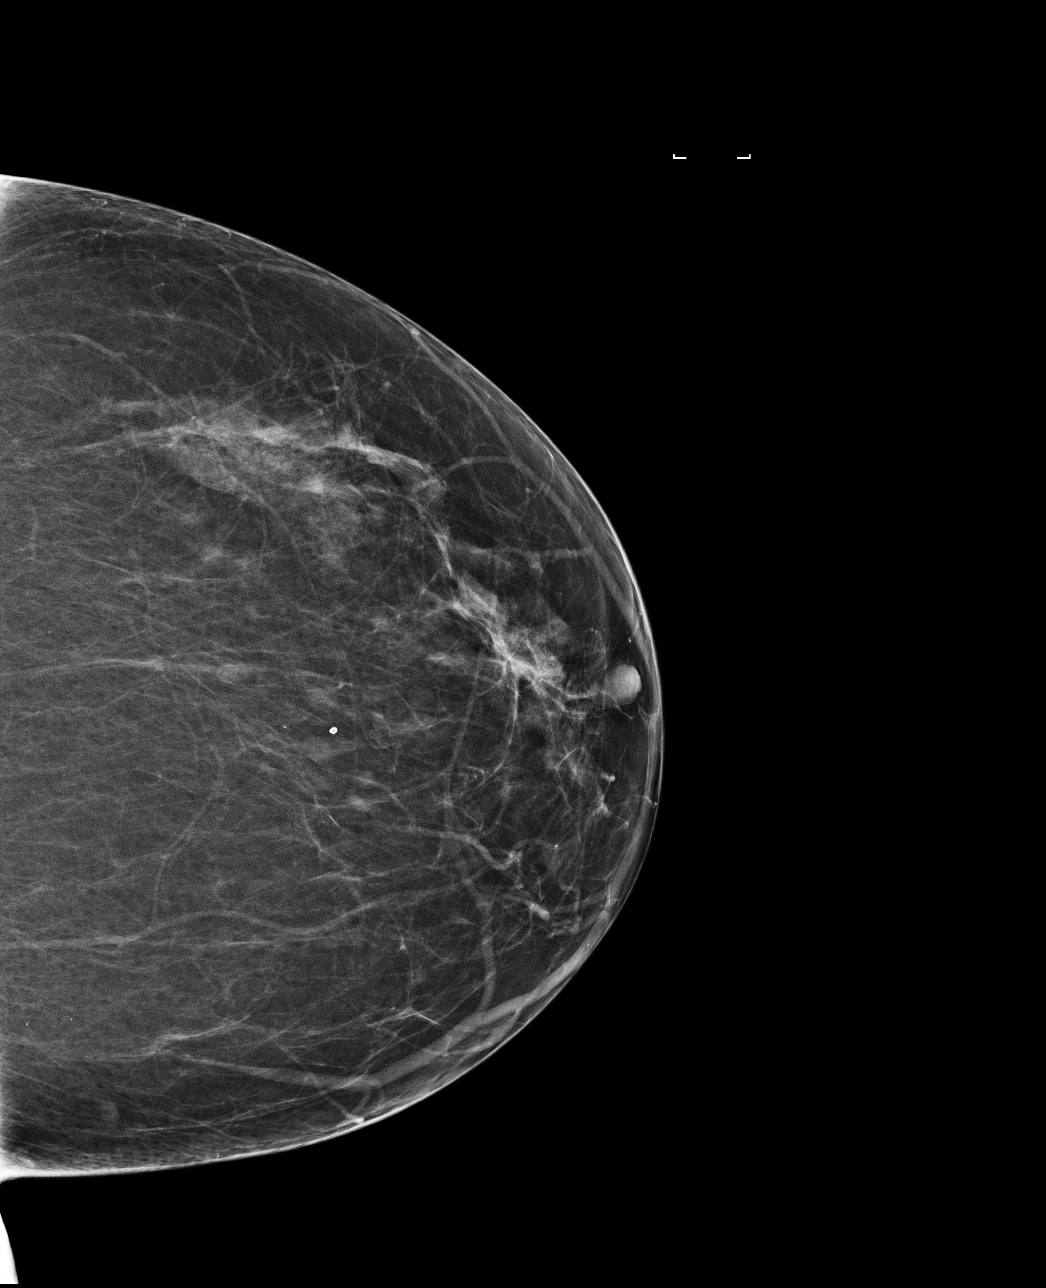

[R MLO]
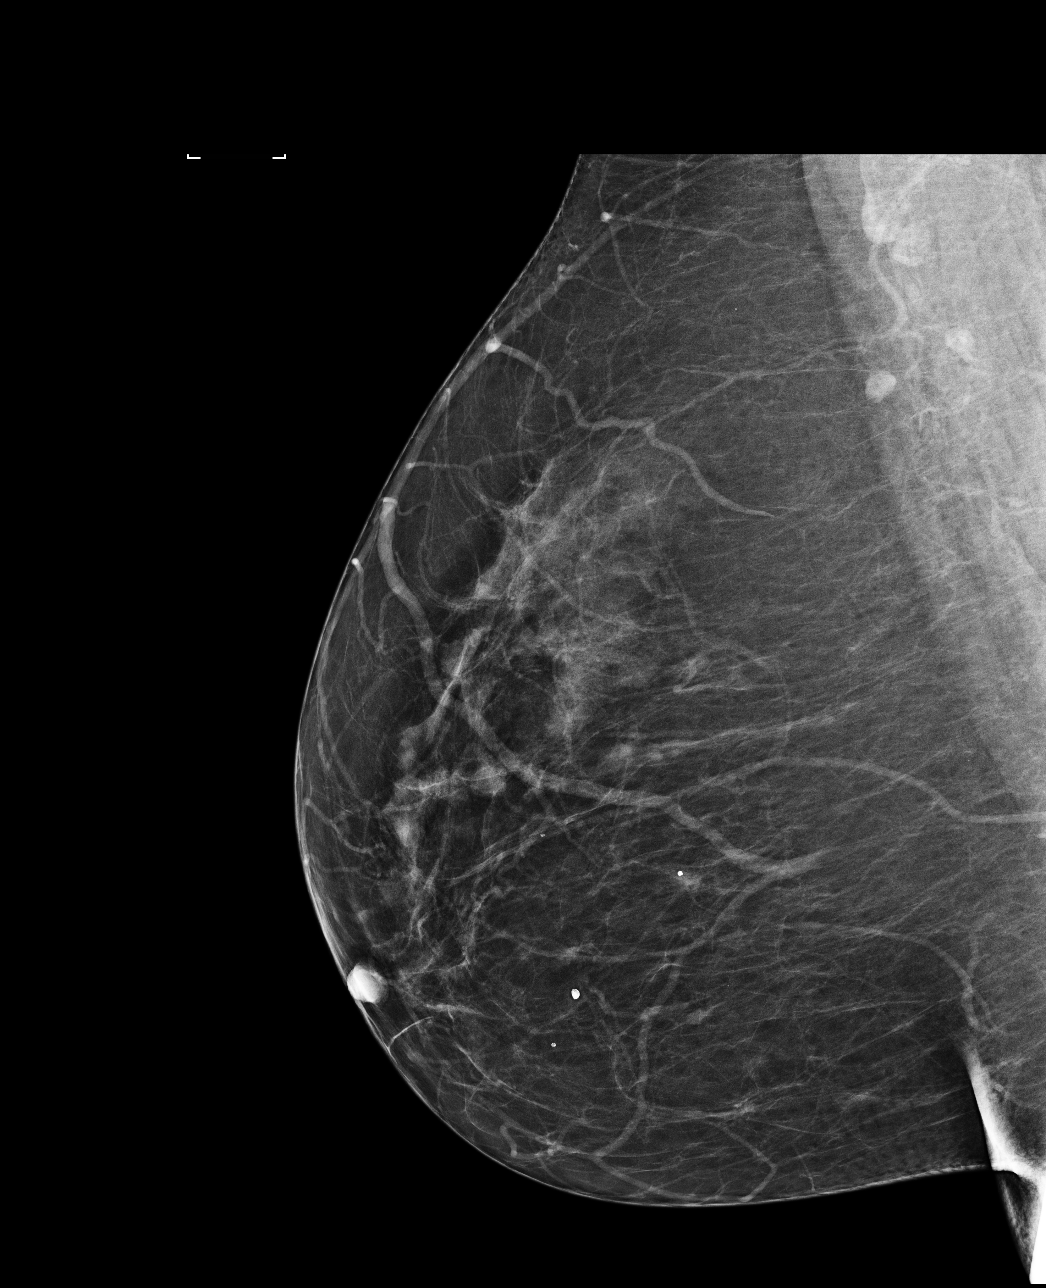

[4 of 4 positions shown; findings below may reference images not displayed]

ACR Breast Density Category b: There are scattered areas of
fibroglandular density.
FINDINGS: There are no findings suspicious for malignancy. Images were
processed with CAD.
IMPRESSION: No mammographic evidence of malignancy. A result letter of this
screening mammogram will be mailed directly to the patient.

RECOMMENDATION:
Screening mammogram in one year. (Code:AS-G-LCT)

BI-RADS CATEGORY  1: Negative.

## 2021-01-08 ENCOUNTER — Other Ambulatory Visit: Payer: Self-pay | Admitting: Nurse Practitioner

## 2021-01-23 ENCOUNTER — Telehealth: Payer: Self-pay

## 2021-01-23 DIAGNOSIS — E039 Hypothyroidism, unspecified: Secondary | ICD-10-CM

## 2021-01-23 MED ORDER — LEVOTHYROXINE SODIUM 75 MCG PO TABS: 75.0000 ug | ORAL_TABLET | Freq: Every day | ORAL | 0 refills | Status: DC

## 2021-01-23 NOTE — Telephone Encounter (Signed)
Levothyroxine refill

## 2021-01-23 NOTE — Telephone Encounter (Signed)
Rx refilled for 30 days, additional refills will be given at next appointment.

## 2021-01-26 ENCOUNTER — Encounter: Payer: Self-pay | Admitting: Nurse Practitioner

## 2021-01-26 ENCOUNTER — Other Ambulatory Visit: Payer: Self-pay

## 2021-01-26 ENCOUNTER — Ambulatory Visit (INDEPENDENT_AMBULATORY_CARE_PROVIDER_SITE_OTHER): Payer: 59 | Admitting: Nurse Practitioner

## 2021-01-26 VITALS — BP 120/71 | HR 61 | Temp 97.3°F | Ht 63.0 in | Wt 165.0 lb

## 2021-01-26 DIAGNOSIS — E039 Hypothyroidism, unspecified: Secondary | ICD-10-CM

## 2021-01-26 DIAGNOSIS — Z13220 Encounter for screening for lipoid disorders: Secondary | ICD-10-CM | POA: Diagnosis not present

## 2021-01-26 DIAGNOSIS — Z1211 Encounter for screening for malignant neoplasm of colon: Secondary | ICD-10-CM | POA: Diagnosis not present

## 2021-01-26 DIAGNOSIS — I10 Essential (primary) hypertension: Secondary | ICD-10-CM

## 2021-01-26 MED ORDER — LEVOTHYROXINE SODIUM 75 MCG PO TABS: 75.0000 ug | ORAL_TABLET | Freq: Every day | ORAL | 0 refills | Status: DC

## 2021-01-26 MED ORDER — LISINOPRIL 5 MG PO TABS: 5.0000 mg | ORAL_TABLET | Freq: Every day | ORAL | 11 refills | Status: DC

## 2021-01-26 NOTE — Patient Instructions (Addendum)

## 2021-01-26 NOTE — Progress Notes (Signed)
[facility] Patient [facility] [address] [place], [place]  [address] Phone:  [phone]   Fax:  [phone]   Established Patient Office Visit  Subjective:  Patient ID: Kendra Dodson, female    DOB: 12/01/1960  Age: 60 y.o. MRN: [id number]  CC:  Chief Complaint  Patient presents with   Follow-up    Patient requesting medication refills and labs. Patient stated she is taking 1/2 Lisinopril 5mg .    HPI Kendra Dodson presents for follow up. She  has a past medical history of Asthma, Fibromyalgia, Hypothyroid, and Migraines.   She reports that she is in today for HTN . She is prescribed lisinopril 5 mg. She reports that using Lisinopril 2.5 mg daily. She continues to eat a well balanced diet and has lost 15 pounds in the last year. She reports the stress has improved. She continues a Claria D[employer]. She has purchased a new home and is happy.  Denies headache, dizziness, visual changes, shortness of breath, dyspnea on exertion, chest pain, nausea, vomiting or any edema.  She has experienced some ear discomfort.   Past Medical History:  Diagnosis Date   Asthma    Fibromyalgia    Hypothyroid    Migraines     Past Surgical History:  Procedure Laterality Date   ADENOIDECTOMY     CHOLECYSTECTOMY      Family History  Problem Relation Age of Onset   Hypertension Mother    Hypertension Father    Diabetes Father     Social History   Socioeconomic History   Marital status: Divorced    Spouse name: Not on file   Number of children: Not on file   Years of education: Not on file   Highest education level: Not on file  Occupational History   Not on file  Tobacco Use   Smoking status: Never   Smokeless tobacco: Never  Vaping Use   Vaping Use: Never used  Substance and Sexual Activity   Alcohol use: Yes    Comment: rare   Drug use: Never   Sexual activity: Not Currently  Other Topics Concern   Not on file  Social History Narrative   Not on file   Social  Determinants of Health   Financial Resource Strain: Not on file  Food Insecurity: Not on file  Transportation Needs: Not on file  Physical Activity: Not on file  Stress: Not on file  Social Connections: Not on file  Intimate Partner Violence: Not on file    Outpatient Medications Prior to Visit  Medication Sig Dispense Refill   levothyroxine (SYNTHROID) 75 MCG tablet Take 1 tablet (75 mcg total) by mouth daily. 30 tablet 0   lisinopril (ZESTRIL) 5 MG tablet Take 1 tablet (5 mg total) by mouth daily. 30 tablet 11   No facility-administered medications prior to visit.    No Known Allergies  ROS Review of Systems    Objective:    Physical Exam HENT:     Head: Normocephalic and atraumatic.     Nose: Nose normal.     Mouth/Throat:     Mouth: Mucous membranes are moist.  Cardiovascular:     Rate and Rhythm: Normal rate and regular rhythm.     Pulses: Normal pulses.     Heart sounds: Normal heart sounds.  Pulmonary:     Effort: Pulmonary effort is normal.     Breath sounds: Normal breath sounds.  Abdominal:     Palpations: Abdomen is soft.  Musculoskeletal:        General: Normal range of motion.  Skin:    General: Skin is warm and dry.     Capillary Refill: Capillary refill takes less than 2 seconds.  Neurological:     General: No focal deficit present.     Mental Status: She is alert and oriented to person, place, and time.  Psychiatric:        Mood and Affect: Mood normal.        Behavior: Behavior normal.        Thought Content: Thought content normal.        Judgment: Judgment normal.    BP 120/71 (BP Location: Left Arm, Patient Position: Sitting)   Pulse 61   Temp (!) 97.3 F (36.3 C)   Ht 5\' 3"  (1.6 m)   Wt 165 lb (74.8 kg)   SpO2 99%   BMI 29.23 kg/m  Wt Readings from Last 3 Encounters:  01/26/21 165 lb (74.8 kg)  01/14/20 180 lb 3.2 oz (81.7 kg)  10/26/19 187 lb (84.8 kg)     Health Maintenance Due  Topic Date Due   INFLUENZA VACCINE   10/10/2020    There are no preventive care reminders to display for this patient.   Lab Results  Component Value Date   WBC 8.7 06/26/2019   HGB 14.6 06/26/2019   HCT 43.5 06/26/2019   MCV 92 06/26/2019   PLT 328 06/26/2019   Lab Results  Component Value Date   HGBA1C 5.4 06/26/2019      Assessment & Plan:   Problem List Items Addressed This Visit       Endocrine   Hypothyroidism - Primary Stable  Labs pending Continue with current regimen.  No changes warranted. Good patient compliance.    Relevant Medications   levothyroxine (SYNTHROID) 75 MCG tablet   Other Relevant Orders   TSH (Completed)   T3 (Completed)   T4, free (Completed)   Other Visit Diagnoses     Colon cancer screening       Relevant Orders   Cologuard   Screening for cholesterol level       Relevant Orders   Lipid panel (Completed)   Primary hypertension     Stable Continue with life style modifications.  Encouraged on going compliance with current medication regimen Encouraged home monitoring and recording BP <130/80 Eating a heart-healthy diet with less salt Encouraged regular physical activity     Relevant Medications   lisinopril (ZESTRIL) 5 MG tablet   Other Relevant Orders   Comp. Metabolic Panel (12) (Completed)       Meds ordered this encounter  Medications   levothyroxine (SYNTHROID) 75 MCG tablet    Sig: Take 1 tablet (75 mcg total) by mouth daily.    Dispense:  30 tablet    Refill:  0    Additional refills will be given at next appointment.   lisinopril (ZESTRIL) 5 MG tablet    Sig: Take 1 tablet (5 mg total) by mouth daily.    Dispense:  30 tablet    Refill:  11     Follow-up: Return in about 5 months (around 06/26/2021) for  .    06/28/2021, NP

## 2021-01-27 LAB — COMP. METABOLIC PANEL (12)
AST: 17 IU/L (ref 0–40)
Albumin/Globulin Ratio: 1.7 (ref 1.2–2.2)
Albumin: 4.5 g/dL (ref 3.8–4.9)
Alkaline Phosphatase: 71 IU/L (ref 44–121)
BUN/Creatinine Ratio: 15 (ref 9–23)
BUN: 13 mg/dL (ref 6–24)
Bilirubin Total: 0.6 mg/dL (ref 0.0–1.2)
Calcium: 10 mg/dL (ref 8.7–10.2)
Chloride: 103 mmol/L (ref 96–106)
Creatinine, Ser: 0.86 mg/dL (ref 0.57–1.00)
Globulin, Total: 2.7 g/dL (ref 1.5–4.5)
Glucose: 81 mg/dL (ref 70–99)
Potassium: 4.6 mmol/L (ref 3.5–5.2)
Sodium: 140 mmol/L (ref 134–144)
Total Protein: 7.2 g/dL (ref 6.0–8.5)
eGFR: 78 mL/min/{1.73_m2} (ref >59)

## 2021-01-27 LAB — LIPID PANEL
Chol/HDL Ratio: 3.1 ratio (ref 0.0–4.4)
Cholesterol, Total: 217 mg/dL — ABNORMAL HIGH (ref 100–199)
HDL: 70 mg/dL (ref >39)
LDL Chol Calc (NIH): 136 mg/dL — ABNORMAL HIGH (ref 0–99)
Triglycerides: 63 mg/dL (ref 0–149)
VLDL Cholesterol Cal: 11 mg/dL (ref 5–40)

## 2021-01-27 LAB — T4, FREE: Free T4: 1.44 ng/dL (ref 0.82–1.77)

## 2021-01-27 LAB — T3: T3, Total: 91 ng/dL (ref 71–180)

## 2021-01-27 LAB — TSH: TSH: 3.2 u[IU]/mL (ref 0.450–4.500)

## 2021-02-09 LAB — COLOGUARD: COLOGUARD: NEGATIVE

## 2021-02-16 ENCOUNTER — Other Ambulatory Visit: Payer: Self-pay | Admitting: Nurse Practitioner

## 2021-02-16 DIAGNOSIS — E039 Hypothyroidism, unspecified: Secondary | ICD-10-CM

## 2021-02-16 MED ORDER — LEVOTHYROXINE SODIUM 75 MCG PO TABS: 75.0000 ug | ORAL_TABLET | Freq: Every day | ORAL | 1 refills | Status: DC

## 2021-05-12 ENCOUNTER — Other Ambulatory Visit: Payer: Self-pay | Admitting: Nurse Practitioner

## 2021-05-12 DIAGNOSIS — E039 Hypothyroidism, unspecified: Secondary | ICD-10-CM

## 2021-05-26 ENCOUNTER — Encounter: Payer: Self-pay | Admitting: Nurse Practitioner

## 2021-05-26 ENCOUNTER — Other Ambulatory Visit: Payer: Self-pay

## 2021-05-26 ENCOUNTER — Ambulatory Visit (INDEPENDENT_AMBULATORY_CARE_PROVIDER_SITE_OTHER): Payer: 59 | Admitting: Nurse Practitioner

## 2021-05-26 VITALS — BP 131/85 | HR 59 | Temp 98.1°F | Ht 63.0 in | Wt 164.2 lb

## 2021-05-26 DIAGNOSIS — I10 Essential (primary) hypertension: Secondary | ICD-10-CM | POA: Diagnosis not present

## 2021-05-26 DIAGNOSIS — E039 Hypothyroidism, unspecified: Secondary | ICD-10-CM | POA: Diagnosis not present

## 2021-05-26 MED ORDER — LEVOTHYROXINE SODIUM 75 MCG PO TABS: 75.0000 ug | ORAL_TABLET | Freq: Every day | ORAL | 1 refills | Status: DC

## 2021-05-26 NOTE — Progress Notes (Signed)
? ?[facility] ?[place], [place]  [address] ?Phone:  [phone]   Fax:  [phone] ? ? ?Established Patient Office Visit ? ?Subjective:  ?Patient ID: Kendra Dodson, female    DOB: 05-Jul-1960  Age: 61 y.o. MRN: [id number] ? ?CC:  ?Chief Complaint  ?Patient presents with  ? Follow-up  ?  Patient is here today for her follow up visit with no concerns or issues to discuss.  ?Patient is needing refills on her levothyroxine medication.  ? ? ?HPI ?Hypothyroidism ?Kendra Dodson is a 60 y.o. female who presents for follow up of hypothyroidism. She has a past medical history of Asthma, Fibromyalgia, Hypothyroid, and Migraines.  Current symptoms: none . Patient denies change in energy level, diarrhea, heat / cold intolerance, nervousness, palpitations, and weight changes. Symptoms have stabilized. ? ?She is prescribed Lisinopril 5 mg daily. Denies headache, dizziness, visual changes, shortness of breath, dyspnea on exertion, chest pain, nausea, vomiting or any edema.  ? ?Past Medical History:  ?Diagnosis Date  ? Asthma   ? Fibromyalgia   ? Hypothyroid   ? Migraines   ? ? ?Past Surgical History:  ?Procedure Laterality Date  ? ADENOIDECTOMY    ? CHOLECYSTECTOMY    ? ? ?Family History  ?Problem Relation Age of Onset  ? Hypertension Mother   ? Hypertension Father   ? Diabetes Father   ? ? ?Social History  ? ?Socioeconomic History  ? Marital status: Divorced  ?  Spouse name: Not on file  ? Number of children: Not on file  ? Years of education: Not on file  ? Highest education level: Not on file  ?Occupational History  ? Not on file  ?Tobacco Use  ? Smoking status: Never  ? Smokeless tobacco: Never  ?Vaping Use  ? Vaping Use: Never used  ?Substance and Sexual Activity  ? Alcohol use: Yes  ?  Comment: rare  ? Drug use: Never  ? Sexual activity: Not Currently  ?Other Topics Concern  ? Not on file  ?Social History Narrative  ? Not on file  ? ?Social Determinants of Health  ? ?Financial Resource Strain:  Not on file  ?Food Insecurity: Not on file  ?Transportation Needs: Not on file  ?Physical Activity: Not on file  ?Stress: Not on file  ?Social Connections: Not on file  ?Intimate Partner Violence: Not on file  ? ? ?Outpatient Medications Prior to Visit  ?Medication Sig Dispense Refill  ? lisinopril (ZESTRIL) 5 MG tablet Take 1 tablet (5 mg total) by mouth daily. 30 tablet 11  ? levothyroxine (SYNTHROID) 75 MCG tablet Take 1 tablet (75 mcg total) by mouth daily. 90 tablet 1  ? ?No facility-administered medications prior to visit.  ? ? ?No Known Allergies ? ?ROS ?Review of Systems  ?Eyes:  Positive for itching.  ? ?  ?Objective:  ?  ?Physical Exam ?Constitutional:   ?   Appearance: She is obese.  ?HENT:  ?   Head: Normocephalic and atraumatic.  ?   Nose: Nose normal.  ?Cardiovascular:  ?   Rate and Rhythm: Normal rate and regular rhythm.  ?   Pulses: Normal pulses.  ?   Heart sounds: Normal heart sounds.  ?Abdominal:  ?   Palpations: Abdomen is soft.  ?Musculoskeletal:     ?   General: Normal range of motion.  ?   Cervical back: Normal range of motion.  ?   Right lower leg: No edema.  ?  Left lower leg: No edema.  ?   Comments: Crepitus ?  ?Skin: ?   General: Skin is warm and dry.  ?   Capillary Refill: Capillary refill takes less than 2 seconds.  ?Neurological:  ?   General: No focal deficit present.  ?   Mental Status: She is alert and oriented to person, place, and time.  ?Psychiatric:     ?   Mood and Affect: Mood normal.     ?   Behavior: Behavior normal.     ?   Thought Content: Thought content normal.     ?   Judgment: Judgment normal.  ? ? ?BP 131/85   Pulse (!) 59   Temp 98.1 ?F (36.7 ?C)   Ht 5' 3"  (1.6 m)   Wt 164 lb 3.2 oz (74.5 kg)   SpO2 100%   BMI 29.09 kg/m?  ?Wt Readings from Last 3 Encounters:  ?05/26/21 164 lb 3.2 oz (74.5 kg)  ?01/26/21 165 lb (74.8 kg)  ?01/14/20 180 lb 3.2 oz (81.7 kg)  ? ? ? ?There are no preventive care reminders to display for this patient. ? ? ?There are no preventive  care reminders to display for this patient. ? ?Lab Results  ?Component Value Date  ? TSH 3.200 01/26/2021  ? ?Lab Results  ?Component Value Date  ? WBC 8.7 06/26/2019  ? HGB 14.6 06/26/2019  ? HCT 43.5 06/26/2019  ? MCV 92 06/26/2019  ? PLT 328 06/26/2019  ? ?Lab Results  ?Component Value Date  ? NA 140 01/26/2021  ? K 4.6 01/26/2021  ? GLUCOSE 81 01/26/2021  ? BUN 13 01/26/2021  ? CREATININE 0.86 01/26/2021  ? BILITOT 0.6 01/26/2021  ? ALKPHOS 71 01/26/2021  ? AST 17 01/26/2021  ? PROT 7.2 01/26/2021  ? ALBUMIN 4.5 01/26/2021  ? CALCIUM 10.0 01/26/2021  ? EGFR 78 01/26/2021  ? ?Lab Results  ?Component Value Date  ? CHOL 217 (H) 01/26/2021  ? ?Lab Results  ?Component Value Date  ? HDL 70 01/26/2021  ? ?Lab Results  ?Component Value Date  ? LDLCALC 136 (H) 01/26/2021  ? ?Lab Results  ?Component Value Date  ? TRIG 63 01/26/2021  ? ?Lab Results  ?Component Value Date  ? CHOLHDL 3.1 01/26/2021  ? ?Lab Results  ?Component Value Date  ? HGBA1C 5.4 06/26/2019  ? ? ?  ?Assessment & Plan:  ? ?Problem List Items Addressed This Visit   ? ?  ? Endocrine  ? Hypothyroidism ?Continue with current regimen.  No changes warranted. Good patient compliance. ?  ? Relevant Medications  ? levothyroxine (SYNTHROID) 75 MCG tablet  ? ?Other Visit Diagnoses   ? ? Primary hypertension    -  Primary ?Stable ?Encouraged on going compliance with current medication regimen ?Encouraged home monitoring and recording BP <130/80 ?Eating a heart-healthy diet with less salt ?Encouraged regular physical activity  ?Recommend Weight loss ?  ? ?  ? ? ?Meds ordered this encounter  ?Medications  ? levothyroxine (SYNTHROID) 75 MCG tablet  ?  Sig: Take 1 tablet (75 mcg total) by mouth daily.  ?  Dispense:  90 tablet  ?  Refill:  1  ?  Additional refills will be given at next appointment.  ? ? ?Follow-up: Return in about 3 months (around 08/26/2021).  ? ? ?Vevelyn Francois, NP ?

## 2021-05-26 NOTE — Patient Instructions (Signed)

## 2021-08-23 ENCOUNTER — Encounter: Payer: Self-pay | Admitting: Nurse Practitioner

## 2021-08-23 ENCOUNTER — Ambulatory Visit (INDEPENDENT_AMBULATORY_CARE_PROVIDER_SITE_OTHER): Payer: 59 | Admitting: Nurse Practitioner

## 2021-08-23 VITALS — BP 137/84 | HR 54 | Temp 98.0°F | Ht 63.0 in | Wt 164.2 lb

## 2021-08-23 DIAGNOSIS — L309 Dermatitis, unspecified: Secondary | ICD-10-CM | POA: Diagnosis not present

## 2021-08-23 DIAGNOSIS — R42 Dizziness and giddiness: Secondary | ICD-10-CM

## 2021-08-23 DIAGNOSIS — I10 Essential (primary) hypertension: Secondary | ICD-10-CM | POA: Diagnosis not present

## 2021-08-23 DIAGNOSIS — E039 Hypothyroidism, unspecified: Secondary | ICD-10-CM

## 2021-08-23 MED ORDER — FAMOTIDINE 40 MG PO TABS: 40.0000 mg | ORAL_TABLET | Freq: Every day | ORAL | 0 refills | Status: DC

## 2021-08-23 MED ORDER — PREDNISONE 20 MG PO TABS: 20.0000 mg | ORAL_TABLET | Freq: Every day | ORAL | 0 refills | Status: DC

## 2021-08-23 MED ORDER — LEVOTHYROXINE SODIUM 75 MCG PO TABS: 75.0000 ug | ORAL_TABLET | Freq: Every day | ORAL | 1 refills | Status: AC

## 2021-08-23 MED ORDER — LISINOPRIL 5 MG PO TABS: 5.0000 mg | ORAL_TABLET | Freq: Every day | ORAL | 11 refills | Status: AC

## 2021-08-23 NOTE — Progress Notes (Signed)
[name],   [id number] Phone:  [phone]   Fax:  [phone] Subjective:   Patient ID: Kendra Dodson, female    DOB: [birth year], 61 y.o.   MRN: [id number]  Chief Complaint  Patient presents with   Follow-up    Patient is here today to discuss her hypothyroidism and itchy rash on both forearms and right leg. Patient states that the rash developed a few days ago and she is not sure if it it poison ivy or something else.   HPI Kendra Dodson 61 y.o. female  has a past medical history of Asthma, Fibromyalgia, Hypothyroid, and Migraines. To the [facility] for reevaluation of hypothyroidism and rash.   States that she suspects that her thyroid medications may need to be adjusted. Verbalizes having difficulty losing weight and increased loss. Endorses being post menopausal.   Also concerned about about new onset rash to BUE and BLE, suspects that she may have poison ivy, has been working in the yard recently. States that rash initially started on the left arm and now is on all extremities. Has had rash with worsening itching x 3-4 days. Took benadryl with mild to moderate improvement in itching. Applied calamine lotion to rash with no improvement. Denies any other concerns today.   Denies any fatigue, chest pain, shortness of breath, HA or dizziness. Denies any blurred vision, numbness or tingling.    Past Medical History:  Diagnosis Date   Asthma    Fibromyalgia    Hypothyroid    Migraines     Past Surgical History:  Procedure Laterality Date   ADENOIDECTOMY     CHOLECYSTECTOMY      Family History  Problem Relation Age of Onset   Hypertension Mother    Hypertension Father    Diabetes Father     Social History   Socioeconomic History   Marital status: Divorced    Spouse name: Not on file   Number of children: Not on file   Years of education: Not on file   Highest education level: Not on file  Occupational History   Not on  file  Tobacco Use   Smoking status: Never   Smokeless tobacco: Never  Vaping Use   Vaping Use: Never used  Substance and Sexual Activity   Alcohol use: Yes    Comment: rare   Drug use: Never   Sexual activity: Not Currently  Other Topics Concern   Not on file  Social History Narrative   Not on file   Social Determinants of Health   Financial Resource Strain: Not on file  Food Insecurity: Not on file  Transportation Needs: Not on file  Physical Activity: Not on file  Stress: Not on file  Social Connections: Not on file  Intimate Partner Violence: Not on file    Outpatient Medications Prior to Visit  Medication Sig Dispense Refill   levothyroxine (SYNTHROID) 75 MCG tablet Take 1 tablet (75 mcg total) by mouth daily. 90 tablet 1   lisinopril (ZESTRIL) 5 MG tablet Take 1 tablet (5 mg total) by mouth daily. 30 tablet 11   No facility-administered medications prior to visit.    No Known Allergies  Review of Systems  Constitutional:  Positive for weight loss. Negative for chills, fever and malaise/fatigue.  Respiratory:  Negative for cough and shortness of breath.   Cardiovascular:  Negative for chest pain, palpitations and leg swelling.  Gastrointestinal:  Negative for abdominal pain, blood in stool,  constipation, diarrhea, nausea and vomiting.  Skin:  Positive for itching and rash.  Neurological: Negative.   Psychiatric/Behavioral:  Negative for depression. The patient is not nervous/anxious.   All other systems reviewed and are negative.      Objective:    Physical Exam Constitutional:      General: She is not in acute distress.    Appearance: Normal appearance. She is normal weight.  HENT:     Head: Normocephalic.  Cardiovascular:     Rate and Rhythm: Normal rate and regular rhythm.     Pulses: Normal pulses.     Heart sounds: Normal heart sounds.     Comments: No obvious peripheral edema Pulmonary:     Effort: Pulmonary effort is normal.     Breath  sounds: Normal breath sounds.  Musculoskeletal:        General: No swelling, tenderness, deformity or signs of injury. Normal range of motion.     Right lower leg: No edema.     Left lower leg: No edema.  Skin:    General: Skin is warm and dry.     Capillary Refill: Capillary refill takes less than 2 seconds.     Findings: Rash present.     Comments: Diffuse rash with mild erythema noted to the BUE and BLE  Neurological:     General: No focal deficit present.     Mental Status: She is alert and oriented to person, place, and time.  Psychiatric:        Mood and Affect: Mood normal.        Behavior: Behavior normal.        Thought Content: Thought content normal.        Judgment: Judgment normal.     BP 137/84   Pulse (!) 54   Temp 98 F (36.7 C)   Ht 5' 3"  (1.6 m)   Wt 164 lb 3.2 oz (74.5 kg)   SpO2 100%   BMI 29.09 kg/m  Wt Readings from Last 3 Encounters:  08/23/21 164 lb 3.2 oz (74.5 kg)  05/26/21 164 lb 3.2 oz (74.5 kg)  01/26/21 165 lb (74.8 kg)    Immunization History  Administered Date(s) Administered   Influenza-Unspecified 01/05/2020   Moderna SARS-COV2 Booster Vaccination 06/22/2020   Moderna Sars-Covid-2 Vaccination 08/01/2020, 01/02/2021   PFIZER(Purple Top)SARS-COV-2 Vaccination 05/29/2019, 06/20/2019   Tdap 07/27/2019    Diabetic Foot Exam - Simple   No data filed     Lab Results  Component Value Date   TSH 3.490 08/23/2021   Lab Results  Component Value Date   WBC 8.6 08/23/2021   HGB 13.9 08/23/2021   HCT 41.8 08/23/2021   MCV 92 08/23/2021   PLT 297 08/23/2021   Lab Results  Component Value Date   NA 140 08/23/2021   K 4.1 08/23/2021   CO2 21 08/23/2021   GLUCOSE 76 08/23/2021   BUN 15 08/23/2021   CREATININE 0.97 08/23/2021   BILITOT 0.5 08/23/2021   ALKPHOS 67 08/23/2021   AST 19 08/23/2021   ALT 12 08/23/2021   PROT 7.1 08/23/2021   ALBUMIN 4.6 08/23/2021   CALCIUM 9.6 08/23/2021   EGFR 67 08/23/2021   Lab Results   Component Value Date   CHOL 261 (H) 08/23/2021   CHOL 217 (H) 01/26/2021   CHOL 221 (H) 09/25/2019   Lab Results  Component Value Date   HDL 69 08/23/2021   HDL 70 01/26/2021   HDL 64 09/25/2019   Lab  Results  Component Value Date   LDLCALC 177 (H) 08/23/2021   LDLCALC 136 (H) 01/26/2021   LDLCALC 145 (H) 09/25/2019   Lab Results  Component Value Date   TRIG 88 08/23/2021   TRIG 63 01/26/2021   TRIG 66 09/25/2019   Lab Results  Component Value Date   CHOLHDL 3.8 08/23/2021   CHOLHDL 3.1 01/26/2021   CHOLHDL 3.5 09/25/2019   Lab Results  Component Value Date   HGBA1C 5.4 08/23/2021   HGBA1C 5.4 06/26/2019       Assessment & Plan:   Problem List Items Addressed This Visit       Endocrine   Hypothyroidism   Relevant Medications   levothyroxine (SYNTHROID) 75 MCG tablet   Other Relevant Orders   TSH+T4F+T3Free (Completed)   Other Visit Diagnoses     Dermatitis    -  Primary   Relevant Medications   predniSONE (DELTASONE) 20 MG tablet   famotidine (PEPCID) 40 MG tablet Discussed non pharmacological methods for management of symptoms Informed to take OTC medications as needed    Primary hypertension       Relevant Medications   lisinopril (ZESTRIL) 5 MG tablet, refilled during visit without change  Encouraged continued diet and exercise efforts  Encouraged continued compliance with medication     Lightheadedness       Relevant Orders   CBC with Differential/Platelet (Completed)   Comprehensive metabolic panel (Completed)   Lipid panel (Completed)   Hemoglobin A1c (Completed)   Follow up in 6  wks for reevaluation of  TSH, 3 mths for reevaluation of chronic illness, sooner as needed    I am having Wendie Utt start on predniSONE and famotidine. I am also having her maintain her levothyroxine and lisinopril.  Meds ordered this encounter  Medications   levothyroxine (SYNTHROID) 75 MCG tablet    Sig: Take 1 tablet (75 mcg total) by mouth daily.     Dispense:  90 tablet    Refill:  1    Additional refills will be given at next appointment.   lisinopril (ZESTRIL) 5 MG tablet    Sig: Take 1 tablet (5 mg total) by mouth daily.    Dispense:  30 tablet    Refill:  11   predniSONE (DELTASONE) 20 MG tablet    Sig: Take 1 tablet (20 mg total) by mouth daily with breakfast for 5 days.    Dispense:  5 tablet    Refill:  0   famotidine (PEPCID) 40 MG tablet    Sig: Take 1 tablet (40 mg total) by mouth at bedtime for 5 days.    Dispense:  5 tablet    Refill:  0     Teena Dunk, NP

## 2021-08-23 NOTE — Patient Instructions (Signed)
You were seen today in the [facility] for reevaluation of HTN. Labs were collected, results will be available via MyChart or, if abnormal, you will be contacted by clinic staff. You were prescribed medications, please take as directed. Follow up in 6 wks for TSH and 3 mths for reevaluation of chronic illness

## 2021-08-24 LAB — CBC WITH DIFFERENTIAL/PLATELET
Basophils Absolute: 0.1 10*3/uL (ref 0.0–0.2)
Basos: 1 %
EOS (ABSOLUTE): 0.1 10*3/uL (ref 0.0–0.4)
Eos: 2 %
Hematocrit: 41.8 % (ref 34.0–46.6)
Hemoglobin: 13.9 g/dL (ref 11.1–15.9)
Immature Grans (Abs): 0 10*3/uL (ref 0.0–0.1)
Immature Granulocytes: 0 %
Lymphocytes Absolute: 3.3 10*3/uL — ABNORMAL HIGH (ref 0.7–3.1)
Lymphs: 39 %
MCH: 30.4 pg (ref 26.6–33.0)
MCHC: 33.3 g/dL (ref 31.5–35.7)
MCV: 92 fL (ref 79–97)
Monocytes Absolute: 0.7 10*3/uL (ref 0.1–0.9)
Monocytes: 8 %
Neutrophils Absolute: 4.3 10*3/uL (ref 1.4–7.0)
Neutrophils: 50 %
Platelets: 297 10*3/uL (ref 150–450)
RBC: 4.57 x10E6/uL (ref 3.77–5.28)
RDW: 12 % (ref 11.7–15.4)
WBC: 8.6 10*3/uL (ref 3.4–10.8)

## 2021-08-24 LAB — COMPREHENSIVE METABOLIC PANEL
ALT: 12 IU/L (ref 0–32)
AST: 19 IU/L (ref 0–40)
Albumin/Globulin Ratio: 1.8 (ref 1.2–2.2)
Albumin: 4.6 g/dL (ref 3.8–4.9)
Alkaline Phosphatase: 67 IU/L (ref 44–121)
BUN/Creatinine Ratio: 15 (ref 9–23)
BUN: 15 mg/dL (ref 6–24)
Bilirubin Total: 0.5 mg/dL (ref 0.0–1.2)
CO2: 21 mmol/L (ref 20–29)
Calcium: 9.6 mg/dL (ref 8.7–10.2)
Chloride: 103 mmol/L (ref 96–106)
Creatinine, Ser: 0.97 mg/dL (ref 0.57–1.00)
Globulin, Total: 2.5 g/dL (ref 1.5–4.5)
Glucose: 76 mg/dL (ref 70–99)
Potassium: 4.1 mmol/L (ref 3.5–5.2)
Sodium: 140 mmol/L (ref 134–144)
Total Protein: 7.1 g/dL (ref 6.0–8.5)
eGFR: 67 mL/min/{1.73_m2} (ref >59)

## 2021-08-24 LAB — HEMOGLOBIN A1C
Est. average glucose Bld gHb Est-mCnc: 108 mg/dL
Hgb A1c MFr Bld: 5.4 % (ref 4.8–5.6)

## 2021-08-24 LAB — LIPID PANEL
Chol/HDL Ratio: 3.8 ratio (ref 0.0–4.4)
Cholesterol, Total: 261 mg/dL — ABNORMAL HIGH (ref 100–199)
HDL: 69 mg/dL (ref >39)
LDL Chol Calc (NIH): 177 mg/dL — ABNORMAL HIGH (ref 0–99)
Triglycerides: 88 mg/dL (ref 0–149)
VLDL Cholesterol Cal: 15 mg/dL (ref 5–40)

## 2021-08-24 LAB — TSH+T4F+T3FREE
Free T4: 1.48 ng/dL (ref 0.82–1.77)
T3, Free: 2.3 pg/mL (ref 2.0–4.4)
TSH: 3.49 u[IU]/mL (ref 0.450–4.500)

## 2021-08-25 ENCOUNTER — Other Ambulatory Visit: Payer: Self-pay | Admitting: Nurse Practitioner

## 2021-08-25 ENCOUNTER — Encounter (HOSPITAL_BASED_OUTPATIENT_CLINIC_OR_DEPARTMENT_OTHER): Payer: Self-pay

## 2021-08-25 ENCOUNTER — Emergency Department (HOSPITAL_BASED_OUTPATIENT_CLINIC_OR_DEPARTMENT_OTHER)
Admission: EM | Admit: 2021-08-25 | Discharge: 2021-08-25 | Disposition: A | Discharge status: Home / Self Care | Payer: 59 | Attending: Emergency Medicine | Admitting: Emergency Medicine

## 2021-08-25 ENCOUNTER — Other Ambulatory Visit: Payer: Self-pay

## 2021-08-25 DIAGNOSIS — R21 Rash and other nonspecific skin eruption: Secondary | ICD-10-CM

## 2021-08-25 DIAGNOSIS — Z79899 Other long term (current) drug therapy: Secondary | ICD-10-CM | POA: Insufficient documentation

## 2021-08-25 DIAGNOSIS — L259 Unspecified contact dermatitis, unspecified cause: Secondary | ICD-10-CM

## 2021-08-25 DIAGNOSIS — E7849 Other hyperlipidemia: Secondary | ICD-10-CM

## 2021-08-25 DIAGNOSIS — J45909 Unspecified asthma, uncomplicated: Secondary | ICD-10-CM | POA: Insufficient documentation

## 2021-08-25 DIAGNOSIS — E039 Hypothyroidism, unspecified: Secondary | ICD-10-CM | POA: Diagnosis not present

## 2021-08-25 MED ORDER — ATORVASTATIN CALCIUM 40 MG PO TABS: 40.0000 mg | ORAL_TABLET | Freq: Every day | ORAL | 3 refills | Status: AC

## 2021-08-25 MED ORDER — DEXAMETHASONE SODIUM PHOSPHATE 10 MG/ML IJ SOLN
10.0000 mg | Freq: Once | INTRAMUSCULAR | Status: AC
  Administered 2021-08-25: 10 mg via INTRAMUSCULAR
  Filled 2021-08-25: qty 1

## 2021-08-25 MED ORDER — DEXAMETHASONE SODIUM PHOSPHATE 10 MG/ML IJ SOLN: 10.0000 mg | Freq: Once | INTRAMUSCULAR | Status: DC

## 2021-08-25 NOTE — ED Provider Notes (Signed)
[facility] EMERGENCY DEPT Provider Note   CSN: [id number] Arrival date & time: 08/25/21  1141     History  Chief Complaint  Patient presents with   Rash    Kendra Dodson is a 61 y.o. female with chief complaint of rash.  Began Monday on the left forearm, has gradually spread to all extremities.  Was working in the yard the day before.  Rash developed a few hours later.  States she has washed all her clothes she used outside, which consisted of shorts and t-shirt.  Seen by internal medicine on 08/23/2021 and started on prednisone with Pepcid.  The benadryl, prednisone, and Pepcid provided little improvement.  Calamine lotion and multiple creams without any improvement.  Denies fevers, N/V/D, headache, vision changes, chest pain, shortness of breath.  No difficulty swallowing.  No new medications or body washes.  Admits to recently switching from topical CBD oil to oral supplements.  The history is provided by the patient and medical records.  Rash      Home Medications Prior to Admission medications   Medication Sig Start Date End Date Taking? Authorizing Provider  atorvastatin (LIPITOR) 40 MG tablet Take 1 tablet (40 mg total) by mouth daily. 08/25/21   Passmore, Enid Derry I, NP  famotidine (PEPCID) 40 MG tablet Take 1 tablet (40 mg total) by mouth at bedtime for 5 days. 08/23/21 08/28/21  Orion Crook I, NP  levothyroxine (SYNTHROID) 75 MCG tablet Take 1 tablet (75 mcg total) by mouth daily. 08/23/21 02/19/22  Passmore, Enid Derry I, NP  lisinopril (ZESTRIL) 5 MG tablet Take 1 tablet (5 mg total) by mouth daily. 08/23/21 08/23/22  Passmore, Enid Derry I, NP  predniSONE (DELTASONE) 20 MG tablet Take 1 tablet (20 mg total) by mouth daily with breakfast for 5 days. 08/23/21 08/28/21  Orion Crook I, NP      Allergies    Patient has no known allergies.    Review of Systems   Review of Systems  Skin:  Positive for rash.    Physical Exam Updated Vital Signs BP 137/89 (BP  Location: Right Arm)   Pulse (!) 56   Temp 97.9 F (36.6 C) (Oral)   Resp 16   Ht 5\' 3"  (1.6 m)   Wt 74.5 kg   SpO2 100%   BMI 29.09 kg/m  Physical Exam Vitals and nursing note reviewed.  Constitutional:      General: She is not in acute distress.    Appearance: She is well-developed. She is not ill-appearing or diaphoretic.  HENT:     Head: Normocephalic and atraumatic.     Mouth/Throat:     Comments: Oral mucosa without appreciated rash Eyes:     Conjunctiva/sclera: Conjunctivae normal.  Cardiovascular:     Rate and Rhythm: Normal rate and regular rhythm.     Heart sounds: No murmur heard. Pulmonary:     Effort: Pulmonary effort is normal. No respiratory distress.     Breath sounds: Normal breath sounds.  Abdominal:     Palpations: Abdomen is soft.     Tenderness: There is no abdominal tenderness.  Musculoskeletal:        General: No swelling.     Cervical back: Neck supple. No rigidity.  Skin:    General: Skin is warm and dry.     Capillary Refill: Capillary refill takes less than 2 seconds.     Coloration: Skin is not pale.     Findings: Rash present.     Comments: Very broadly distributed,  small scattered areas of erythematous rash.  No vesicles, urticaria, bullous, blisters, ulceration, or scaling appreciated.  Distributional pattern varies between small/localized to linear depending on location.  At various stages of healing.  Without surrounding evidence of infection, warmth, or discharge.  Neurological:     Mental Status: She is alert and oriented to person, place, and time.  Psychiatric:        Mood and Affect: Mood normal.     ED Results / Procedures / Treatments   Labs (all labs ordered are listed, but only abnormal results are displayed) Labs Reviewed - No data to display  EKG None  Radiology No results found.  Procedures Procedures    Medications Ordered in ED Medications  dexamethasone (DECADRON) injection 10 mg (10 mg Intramuscular Given  08/25/21 1344)    ED Course/ Medical Decision Making/ A&P                           Medical Decision Making Amount and/or Complexity of Data Reviewed External Data Reviewed: notes. Labs: ordered. Decision-making details documented in ED Course. Radiology: ordered and independent interpretation performed. Decision-making details documented in ED Course. ECG/medicine tests: ordered and independent interpretation performed. Decision-making details documented in ED Course.  Risk OTC drugs. Prescription drug management.   61 y.o. female presents to the ED for concern of Rash   This involves an extensive number of treatment options, and is a complaint that carries with it a high risk of complications and morbidity.    Past Medical History / Co-morbidities / Social History: Hx of hypothyroidism, migraines, asthma, fibromyalgia, prior cholecystectomy, prior adenoidectomy.  Additional History:  Internal and external records from outside source obtained and reviewed including Internal Medicine  Physical Exam: Physical exam performed. The pertinent findings include:    Rash on the wrist = rash origin     ^Rash on the legs appeared 4 days ago   [name] on the neck appeared 2 days ago  Lab Tests: None  Imaging Studies: None  Medications: I have reviewed the patients home medicines and have made adjustments as needed  ED Course/Disposition: Pt well-appearing on exam.  Presenting with rash to the forearm that has slowly scattered to a few sparse areas on extremities.  Rash consistent with contact dermatitis.  Patient denies any difficulty breathing or swallowing.  Pt has a patent airway without stridor and is handling secretions without difficulty; no angioedema.  No recent tick bites.  No blisters, no pustules, no warmth, no draining sinus tracts, no superficial abscesses, no bullous impetigo, no vesicles, no desquamation, no target lesions with dusky purpura or a central bulla.   Not tender to touch.  Rash spares oral mucosa, palms, and soles.  No concern for superimposed infection.  Low concern for SJS, TEN, TSS, tick borne illness, syphilis or other life-threatening condition.  Plan to discharge home with short course of steroids, pepcid and recommend Benadryl as needed for pruritis.  Recommend close follow-up with PCP for reevaluation and continued medical management.  Also provided resources for local dermatologist.  Patient in NAD in good condition at time of discharge.  After consideration of the diagnostic results and the patient's encounter today, I feel that the emergency department workup does not suggest an emergent condition requiring admission or immediate intervention beyond what has been performed at this time.  The patient is safe for discharge and has been instructed to return immediately for worsening symptoms, change in symptoms or any  other concerns.  Discussed course of treatment thoroughly with the patient, whom demonstrated understanding.  Patient in agreement and has no further questions.  I discussed this case with my attending physician Dr. Rosalia Hammers, who agreed with the proposed treatment course and cosigned this note including patient's presenting symptoms, physical exam, and planned diagnostics and interventions.  Attending physician stated agreement with plan or made changes to plan which were implemented.     This chart was dictated using voice recognition software.  Despite best efforts to proofread, errors can occur which can change the documentation meaning.         Final Clinical Impression(s) / ED Diagnoses Final diagnoses:  Rash in adult  Contact dermatitis, unspecified contact dermatitis type, unspecified trigger    Rx / DC Orders ED Discharge Orders     None         Sandrea Hammond 08/25/21 2256    Margarita Grizzle, MD 08/28/21 [phone]

## 2021-08-25 NOTE — Discharge Instructions (Signed)
Please follow-up with your PCP within the next 2 to 3 days for reevaluation and continued medical management.  You have also been provided the contact information for a local dermatologist.  Please call and schedule an appointment within the next few days for further consultation.  Continue taking your prednisone as prescribed.  You may also continue to take Benadryl, Pepcid, and calamine lotion as needed.  Return to the ED for new or worsening symptoms.

## 2021-08-25 NOTE — ED Notes (Signed)
Patient verbalizes understanding of discharge instructions. Opportunity for questioning and answers were provided. Patient discharged from ED.  °

## 2021-08-25 NOTE — ED Triage Notes (Signed)
Patient here POV from Home.  Endorses having an Area of Localized Redness to Left Distal Forearm. Itching began on Monday and since it has now become more generalized throughout Body.   Provided Prednisone and has been taking Multiple Creams.  Was in [place] on Sunday. No Fevers. No N/V/D.  NAD Noted during Triage. A&Ox4. GCS 15. Ambulatory.

## 2021-08-28 ENCOUNTER — Encounter: Payer: Self-pay | Admitting: Nurse Practitioner

## 2021-08-28 ENCOUNTER — Ambulatory Visit (INDEPENDENT_AMBULATORY_CARE_PROVIDER_SITE_OTHER): Payer: 59 | Admitting: Nurse Practitioner

## 2021-08-28 DIAGNOSIS — L309 Dermatitis, unspecified: Secondary | ICD-10-CM | POA: Diagnosis not present

## 2021-08-28 MED ORDER — HYDROXYZINE PAMOATE 25 MG PO CAPS: 25.0000 mg | ORAL_CAPSULE | Freq: Three times a day (TID) | ORAL | 0 refills | Status: DC | PRN

## 2021-08-28 MED ORDER — PREDNISONE 20 MG PO TABS: 20.0000 mg | ORAL_TABLET | Freq: Every day | ORAL | 0 refills | Status: DC

## 2021-08-28 NOTE — Progress Notes (Signed)
Virtual Visit via Telephone Note  I connected with Kendra Dodson, 61 y/o female, on 08/28/21 at  3:20 PM EDT by telephone and verified that I am speaking with the correct person using two identifiers.   I discussed the limitations, risks, security and privacy concerns of performing an evaluation and management service by telephone and the availability of in person appointments. I also discussed with the patient that there may be a patient responsible charge related to this service. The patient expressed understanding and agreed to proceed.  Patient home Provider Office  History of Present Illness:  Kendra Dodson  has a past medical history of Asthma, Fibromyalgia, Hypothyroid, and Migraines. To the [facility] for reevaluation of dermatitis.   States that she was seen in the ED since last visit and was given a steroid injection and informed to continue with the previously established treatment plan.Patient states that she continues to have rash and increased itching. Subsided briefly after steroid injection and has now returned. Requesting refill of prednisone and for additional medication of itching. Has upcoming appointment with dermatology.    Review of Systems  Constitutional:  Negative for chills, fever and malaise/fatigue.  HENT: Negative.    Eyes: Negative.   Respiratory:  Negative for cough and shortness of breath.   Cardiovascular:  Negative for chest pain, palpitations and leg swelling.  Gastrointestinal:  Negative for abdominal pain, blood in stool, constipation, diarrhea, nausea and vomiting.  Genitourinary: Negative.   Musculoskeletal: Negative.   Skin:  Positive for itching and rash.  Neurological: Negative.   Psychiatric/Behavioral:  Negative for depression. The patient is not nervous/anxious.   All other systems reviewed and are negative.    Observations/Objective: No exam; telephone visit  Assessment and Plan: 1. Dermatitis - predniSONE (DELTASONE) 20 MG tablet; Take 1 tablet  (20 mg total) by mouth daily with breakfast for 5 days.  Dispense: 5 tablet; Refill: 0, refilled without change - hydrOXYzine (VISTARIL) 25 MG capsule; Take 1 capsule (25 mg total) by mouth every 8 (eight) hours as needed.  Dispense: 30 capsule; Refill: 0, initiated during visit  - Ambulatory referral to Allergy Discussed non pharmacological methods for management of symptoms Informed to take OTC medications as needed Given anticipatory guidance    Follow Up Instructions: Maintain upcoming follow up with PCP, sooner as needed    I discussed the assessment and treatment plan with the patient. The patient was provided an opportunity to ask questions and all were answered. The patient agreed with the plan and demonstrated an understanding of the instructions.   The patient was advised to call back or seek an in-person evaluation if the symptoms worsen or if the condition fails to improve as anticipated.  I provided 20 minutes of telephone- visit time during this encounter.   Kathrynn Speed, NP

## 2021-08-30 ENCOUNTER — Emergency Department (HOSPITAL_COMMUNITY)
Admission: EM | Admit: 2021-08-30 | Discharge: 2021-09-01 | Disposition: A | Discharge status: Other Acute Inpatient Hospital | Payer: 59 | Attending: Emergency Medicine | Admitting: Emergency Medicine

## 2021-08-30 ENCOUNTER — Emergency Department (HOSPITAL_COMMUNITY): Payer: 59

## 2021-08-30 DIAGNOSIS — R4182 Altered mental status, unspecified: Secondary | ICD-10-CM

## 2021-08-30 DIAGNOSIS — Z046 Encounter for general psychiatric examination, requested by authority: Secondary | ICD-10-CM | POA: Diagnosis not present

## 2021-08-30 DIAGNOSIS — R410 Disorientation, unspecified: Secondary | ICD-10-CM | POA: Insufficient documentation

## 2021-08-30 DIAGNOSIS — F32A Depression, unspecified: Secondary | ICD-10-CM | POA: Diagnosis not present

## 2021-08-30 DIAGNOSIS — Z79899 Other long term (current) drug therapy: Secondary | ICD-10-CM | POA: Insufficient documentation

## 2021-08-30 DIAGNOSIS — Z20822 Contact with and (suspected) exposure to covid-19: Secondary | ICD-10-CM | POA: Diagnosis not present

## 2021-08-30 DIAGNOSIS — R7309 Other abnormal glucose: Secondary | ICD-10-CM | POA: Diagnosis not present

## 2021-08-30 LAB — URINALYSIS, ROUTINE W REFLEX MICROSCOPIC
Bacteria, UA: NONE SEEN
Bilirubin Urine: NEGATIVE
Glucose, UA: NEGATIVE mg/dL
Hgb urine dipstick: NEGATIVE
Ketones, ur: 20 mg/dL — AB
Nitrite: NEGATIVE
Protein, ur: NEGATIVE mg/dL
Specific Gravity, Urine: 1.013 (ref 1.005–1.030)
pH: 8 (ref 5.0–8.0)

## 2021-08-30 LAB — CBC WITH DIFFERENTIAL/PLATELET
Abs Immature Granulocytes: 0.12 10*3/uL — ABNORMAL HIGH (ref 0.00–0.07)
Basophils Absolute: 0 10*3/uL (ref 0.0–0.1)
Basophils Relative: 0 %
Eosinophils Absolute: 0 10*3/uL (ref 0.0–0.5)
Eosinophils Relative: 0 %
HCT: 41.3 % (ref 36.0–46.0)
Hemoglobin: 14.3 g/dL (ref 12.0–15.0)
Immature Granulocytes: 1 %
Lymphocytes Relative: 15 %
Lymphs Abs: 1.5 10*3/uL (ref 0.7–4.0)
MCH: 30.7 pg (ref 26.0–34.0)
MCHC: 34.6 g/dL (ref 30.0–36.0)
MCV: 88.6 fL (ref 80.0–100.0)
Monocytes Absolute: 0.3 10*3/uL (ref 0.1–1.0)
Monocytes Relative: 3 %
Neutro Abs: 8.1 10*3/uL — ABNORMAL HIGH (ref 1.7–7.7)
Neutrophils Relative %: 81 %
Platelets: 320 10*3/uL (ref 150–400)
RBC: 4.66 MIL/uL (ref 3.87–5.11)
RDW: 12.5 % (ref 11.5–15.5)
WBC: 10 10*3/uL (ref 4.0–10.5)
nRBC: 0 % (ref 0.0–0.2)

## 2021-08-30 LAB — COMPREHENSIVE METABOLIC PANEL
ALT: 25 U/L (ref 0–44)
AST: 27 U/L (ref 15–41)
Albumin: 4.4 g/dL (ref 3.5–5.0)
Alkaline Phosphatase: 55 U/L (ref 38–126)
Anion gap: 10 (ref 5–15)
BUN: 18 mg/dL (ref 6–20)
CO2: 19 mmol/L — ABNORMAL LOW (ref 22–32)
Calcium: 9.8 mg/dL (ref 8.9–10.3)
Chloride: 110 mmol/L (ref 98–111)
Creatinine, Ser: 1.09 mg/dL — ABNORMAL HIGH (ref 0.44–1.00)
GFR, Estimated: 59 mL/min — ABNORMAL LOW (ref >60)
Glucose, Bld: 155 mg/dL — ABNORMAL HIGH (ref 70–99)
Potassium: 4 mmol/L (ref 3.5–5.1)
Sodium: 139 mmol/L (ref 135–145)
Total Bilirubin: 0.8 mg/dL (ref 0.3–1.2)
Total Protein: 7.7 g/dL (ref 6.5–8.1)

## 2021-08-30 LAB — CBG MONITORING, ED: Glucose-Capillary: 167 mg/dL — ABNORMAL HIGH (ref 70–99)

## 2021-08-30 LAB — RAPID URINE DRUG SCREEN, HOSP PERFORMED
Amphetamines: NOT DETECTED
Barbiturates: NOT DETECTED
Benzodiazepines: NOT DETECTED
Cocaine: NOT DETECTED
Opiates: NOT DETECTED
Tetrahydrocannabinol: POSITIVE — AB

## 2021-08-30 LAB — TROPONIN I (HIGH SENSITIVITY): Troponin I (High Sensitivity): 10 ng/L (ref <18)

## 2021-08-30 LAB — ETHANOL: Alcohol, Ethyl (B): <10 mg/dL (ref <10)

## 2021-08-30 MED ORDER — HYDROXYZINE PAMOATE 25 MG PO CAPS: 25.0000 mg | ORAL_CAPSULE | Freq: Three times a day (TID) | ORAL | Status: DC | PRN

## 2021-08-30 MED ORDER — LISINOPRIL 10 MG PO TABS
5.0000 mg | ORAL_TABLET | Freq: Every day | ORAL | Status: DC
  Administered 2021-08-31 – 2021-09-01 (×2): 5 mg via ORAL
  Filled 2021-08-30 (×2): qty 1

## 2021-08-30 MED ORDER — LEVOTHYROXINE SODIUM 50 MCG PO TABS
75.0000 ug | ORAL_TABLET | Freq: Every day | ORAL | Status: DC
  Administered 2021-08-31 – 2021-09-01 (×2): 75 ug via ORAL
  Filled 2021-08-30 (×2): qty 1

## 2021-08-30 MED ORDER — FAMOTIDINE 20 MG PO TABS
40.0000 mg | ORAL_TABLET | Freq: Every day | ORAL | Status: DC
  Administered 2021-08-31: 40 mg via ORAL
  Filled 2021-08-30: qty 2

## 2021-08-30 MED ORDER — ATORVASTATIN CALCIUM 40 MG PO TABS
40.0000 mg | ORAL_TABLET | Freq: Every day | ORAL | Status: DC
  Administered 2021-08-31 – 2021-09-01 (×2): 40 mg via ORAL
  Filled 2021-08-30 (×2): qty 1

## 2021-08-30 MED ORDER — HYDROXYZINE HCL 25 MG PO TABS
25.0000 mg | ORAL_TABLET | Freq: Three times a day (TID) | ORAL | Status: DC | PRN
Start: 2021-08-30 — End: 2021-09-01
  Administered 2021-09-01: 25 mg via ORAL
  Filled 2021-08-30: qty 1

## 2021-08-30 MED ORDER — HYDROCORTISONE 0.5 % EX OINT
TOPICAL_OINTMENT | Freq: Two times a day (BID) | CUTANEOUS | Status: DC
Start: 2021-08-31 — End: 2021-08-31
  Filled 2021-08-30: qty 28.35

## 2021-08-30 NOTE — ED Provider Notes (Signed)
[facility]-EMERGENCY DEPT Provider Note   CSN: [id number] Arrival date & time: 08/30/21  1827     History  Chief Complaint  Patient presents with   Altered Mental Status    Kendra Dodson is a 61 y.o. female.  HPI 61 year old female presents with altered mental status and concern for psychiatric disease.  History is from the daughter who is at the bedside.  The patient was last seen well yesterday by the brother.  No recent injuries.  Today she has been acting odd.  She has been saying things that sometimes do not make sense in talking about people that are no longer in her lives.  She is also been focusing on things such as Donte's inferno and Lamona Curl.  Sometimes she will just laugh.  Daughter states that she does have some depression that seems well controlled but about 9 years ago she did have to be admitted to a psychiatric unit for about a week for similar presentation to today.  Patient denies any acute complaints and denies any pain such as headache or chest pain.  Of note, the patient has recently had a rash and 5 days ago had to go to the emergency department for this rash.  She was put on steroids, received her prescription 2 days ago.  Home Medications Prior to Admission medications   Medication Sig Start Date End Date Taking? Authorizing Provider  atorvastatin (LIPITOR) 40 MG tablet Take 1 tablet (40 mg total) by mouth daily. 08/25/21   Passmore, Enid Derry I, NP  famotidine (PEPCID) 40 MG tablet Take 1 tablet (40 mg total) by mouth at bedtime for 5 days. 08/23/21 08/28/21  Orion Crook I, NP  hydrOXYzine (VISTARIL) 25 MG capsule Take 1 capsule (25 mg total) by mouth every 8 (eight) hours as needed. 08/28/21   Orion Crook I, NP  levothyroxine (SYNTHROID) 75 MCG tablet Take 1 tablet (75 mcg total) by mouth daily. 08/23/21 02/19/22  Passmore, Enid Derry I, NP  lisinopril (ZESTRIL) 5 MG tablet Take 1 tablet (5 mg total) by mouth daily. 08/23/21 08/23/22  Orion Crook I, NP      Allergies    Patient has no known allergies.    Review of Systems   Review of Systems  Unable to perform ROS: Mental status change    Physical Exam Updated Vital Signs BP 123/84   Pulse 61   Temp 98.4 F (36.9 C) (Oral)   Resp 19   Ht 5\' 3"  (1.6 m)   Wt 81.6 kg   SpO2 96%   BMI 31.89 kg/m  Physical Exam Vitals and nursing note reviewed.  Constitutional:      Appearance: She is well-developed.  HENT:     Head: Normocephalic and atraumatic.  Eyes:     Extraocular Movements: Extraocular movements intact.     Pupils: Pupils are equal, round, and reactive to light.  Cardiovascular:     Rate and Rhythm: Normal rate and regular rhythm.     Heart sounds: Normal heart sounds.  Pulmonary:     Effort: Pulmonary effort is normal.     Breath sounds: Normal breath sounds.  Abdominal:     Palpations: Abdomen is soft.     Tenderness: There is no abdominal tenderness.  Musculoskeletal:     Cervical back: No rigidity.  Skin:    General: Skin is warm and dry.  Neurological:     Mental Status: She is alert. She is disoriented.     Comments: Patient  knows she is in the hospital but thinks it is May and cannot tell me what year it is.  She will at times intermittently laugh or say things that do not quite make sense.  As well as about to leave she started talking about her third eye and how long this been going on. CN 3-12 grossly intact. 5/5 strength in all 4 extremities. Grossly normal sensation. Normal finger to nose.      ED Results / Procedures / Treatments   Labs (all labs ordered are listed, but only abnormal results are displayed) Labs Reviewed  COMPREHENSIVE METABOLIC PANEL - Abnormal; Notable for the following components:      Result Value   CO2 19 (*)    Glucose, Bld 155 (*)    Creatinine, Ser 1.09 (*)    GFR, Estimated 59 (*)    All other components within normal limits  CBC WITH DIFFERENTIAL/PLATELET - Abnormal; Notable for the following  components:   Neutro Abs 8.1 (*)    Abs Immature Granulocytes 0.12 (*)    All other components within normal limits  URINALYSIS, ROUTINE W REFLEX MICROSCOPIC - Abnormal; Notable for the following components:   Ketones, ur 20 (*)    Leukocytes,Ua SMALL (*)    All other components within normal limits  RAPID URINE DRUG SCREEN, HOSP PERFORMED - Abnormal; Notable for the following components:   Tetrahydrocannabinol POSITIVE (*)    All other components within normal limits  CBG MONITORING, ED - Abnormal; Notable for the following components:   Glucose-Capillary 167 (*)    All other components within normal limits  ETHANOL  TROPONIN I (HIGH SENSITIVITY)    EKG EKG Interpretation  Date/Time:  Wednesday August 30 2021 21:58:57 EDT Ventricular Rate:  66 PR Interval:  159 QRS Duration: 90 QT Interval:  423 QTC Calculation: 444 R Axis:   -7 Text Interpretation: Sinus rhythm Left ventricular hypertrophy Inferior infarct, old Probable anterior infarct, age indeterminate similar to earlier in the day Confirmed by Pricilla Loveless [phone]) on 08/30/2021 10:11:40 PM  Radiology No results found.  Procedures Procedures    Medications Ordered in ED Medications  atorvastatin (LIPITOR) tablet 40 mg (has no administration in time range)  famotidine (PEPCID) tablet 40 mg (has no administration in time range)  hydrOXYzine (VISTARIL) capsule 25 mg (has no administration in time range)  levothyroxine (SYNTHROID) tablet 75 mcg (has no administration in time range)  lisinopril (ZESTRIL) tablet 5 mg (has no administration in time range)  hydrocortisone ointment 0.5 % (has no administration in time range)    ED Course/ Medical Decision Making/ A&P Clinical Course as of 08/30/21 2314  Wed Aug 30, 2021  1929 I discussed with Dr. Amada Jupiter of neurology.  Given the timeframe in relation to her recent steroid prescription and torment and now awakened psychosis, this is almost undoubtedly steroid induced  psychosis.  Given otherwise benign neuro exam, CT head and MRI would not be helpful and electrolytes should be pursued but otherwise would treat as a typical psychiatric case and stop steroids. [SG]  2146 Patient's lab work is overall unremarkable.  Slight bump in her creatinine but white blood cells are normal and electrolytes are unremarkable.  I went back to talk to daughters and the patient is now complaining of a little bit of chest pain though patient has a hard time describing what it is.  My suspicion that this is ACS is fairly low but will get EKG and troponins.  However I otherwise think she  is medically cleared for psychiatric disposition as this appears to be a psychosis. [SG]    Clinical Course User Index [SG] Pricilla Loveless, MD                           Medical Decision Making Amount and/or Complexity of Data Reviewed Independent Historian:     Details: Daughter External Data Reviewed: notes. Labs: ordered. Decision-making details documented in ED Course. ECG/medicine tests: ordered and independent interpretation performed.  Risk OTC drugs. Prescription drug management.   Patient is acutely psychotic.  This appears to be most likely steroid-induced as above.  With no focal neurodeficits, we will defer on CT after discussion with neuro.  Lab work is overall unremarkable besides slight bump in creatinine.  She is describing some atypical chest pain now which she is having a hard time describing so we will send troponins but low suspicion for ACS.  First troponin is negative.  ECG is nonspecific as earlier in the day but no obvious acute ischemia.  I think she is cleared for psychiatric disposition.  Will get second troponin as well out of caution.        Final Clinical Impression(s) / ED Diagnoses Final diagnoses:  None    Rx / DC Orders ED Discharge Orders     None         Pricilla Loveless, MD 08/30/21 2315

## 2021-08-30 NOTE — ED Triage Notes (Signed)
Triage assisted by pt's daughter, who states that pt has been altered today. Daughter states that pt usually takes CBD at night to sleep, but was unable to sleep last night. She woke up this morning and was going to drive herself to a PT appointment and was gone from 1000 to 1500, but was altered afterwards. Unclear on true last known well. Pt alert to person only. Denies drug use. No somatic complaints. Pt has some expressive aphasia and inappropriate words during triage. Pupils 5 PERRLA. CBG 167.

## 2021-08-31 ENCOUNTER — Other Ambulatory Visit: Payer: Self-pay

## 2021-08-31 LAB — TROPONIN I (HIGH SENSITIVITY): Troponin I (High Sensitivity): 9 ng/L (ref <18)

## 2021-08-31 LAB — RESP PANEL BY RT-PCR (FLU A&B, COVID) ARPGX2
Influenza A by PCR: NEGATIVE
Influenza B by PCR: NEGATIVE
SARS Coronavirus 2 by RT PCR: NEGATIVE

## 2021-08-31 MED ORDER — ZIPRASIDONE MESYLATE 20 MG IM SOLR
20.0000 mg | Freq: Once | INTRAMUSCULAR | Status: AC
  Administered 2021-08-31: 20 mg via INTRAMUSCULAR
  Filled 2021-08-31: qty 20

## 2021-08-31 MED ORDER — HYDROCORTISONE 0.5 % EX CREA
TOPICAL_CREAM | Freq: Two times a day (BID) | CUTANEOUS | Status: DC
  Filled 2021-08-31: qty 28.35

## 2021-08-31 MED ORDER — HYDROXYZINE HCL 25 MG PO TABS
50.0000 mg | ORAL_TABLET | Freq: Once | ORAL | Status: DC
  Filled 2021-08-31: qty 2

## 2021-08-31 MED ORDER — STERILE WATER FOR INJECTION IJ SOLN
INTRAMUSCULAR | Status: AC
  Administered 2021-08-31: 1.2 mL
  Filled 2021-08-31: qty 10
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
## 2021-08-31 NOTE — ED Notes (Signed)
Pt daughter, Marvena Tally, at bedside. Pt verbally authorized discussion of her medical care with her daughter. Lelon Mast said pt did not sleep Tues or Wed nights. Additionally, Samantha doesn't think pt slept at night since starting steroids last week.

## 2021-08-31 NOTE — ED Notes (Signed)
Lunch tray given. 

## 2021-08-31 NOTE — Progress Notes (Addendum)
Inpatient Behavioral Health Placement  Pt meets inpatient criteria per Sindy Guadeloupe, NP, There are no available beds at [facility] per [facility], RN. Referral was sent to the following facilities;   Destination Service Provider Address Phone [facility]  [address] [place], [place] [address] [phone]  [facility]  [address] [place] [address] [phone]  [facility]  [address] [place] [address] [phone]  [facility]  [address] [place] [address] [phone]  [facility]  [address] [place] [address] [phone]  [facility]  [address] [place] [address] [phone]  [facility] [address]  [address] [place] [address] [phone]  [facility]  [address] [place] [address] [phone]  [facility]  [address] [place], [place] [address] [phone]  [facility]  [address] [place] [address] [phone]  [facility]  [address] [place] [address] [phone]  [facility]  [address], [place] [address] [phone]  [facility]  [address] [place] [address] [phone]  [facility]  [address], [place] [address] [phone]  [facility]  [address] [place] [address] [phone]  [facility]  [address] [place] [address] [phone]  [facility]  [address], [place]  [address] [phone]  [facility]  [address] [place] [address] [phone]  [facility]  [address] [place] [address] [phone]    Situation ongoing,  CSW will follow up.   Maryjean Ka, MSW, [facility] 08/31/2021  @ 6:03 PM

## 2021-08-31 NOTE — ED Notes (Signed)
Pt daughter is visiting. 

## 2021-08-31 NOTE — ED Notes (Signed)
Pt was cooperative this shift. Two to three times per hour, pt exited her room and required redirection to her room. Pt smiled and laughed loudly throughout the day even when redirected to her room. Pt appeared euphoric and energetic. At 3:20 PM, pt exited her room and was redirected. In response, pt said, "You just wait, you tell him how it is" and laughed. Pt's children Kendra Dodson, and Kendra Dodson visited pt. Pt authorized discussion of her medical care with her children. She declined to sign the written authorization for consent. Pt asked that we obtain her verbal authorization prior to speaking with them. They requested medication to assist with her sleep tonight. At 5:50 PM, pt walked outside her room and said she was scared. We turned on the light, and she returned to the room. At 5:58 PM, pt pressed the call bell. When asked what she needed, she said she did not know.

## 2021-08-31 NOTE — ED Notes (Signed)
All of patients belongings taken home by her 2 daughters.

## 2021-08-31 NOTE — BH Assessment (Signed)
Clinician messaged Ariel K.Effie Shy, RN: "Hey. It's Trey with TTS. Is the pt able to engage in the assessment, if so the pt will need to be placed in a private room. Also is the pt under IVC? Is the pt medically cleared?"   Clinician awaiting response.     Redmond Pulling, MS, [facility], [facility] Triage Specialist [phone]

## 2021-08-31 NOTE — ED Notes (Signed)
Pt has been changed into purple  scrubs.

## 2021-08-31 NOTE — BH Assessment (Addendum)
[facility] Assessment Progress Note   Per Sindy Guadeloupe, NP, this voluntary pt requires psychiatric hospitalization at this time.  The following facilities have been contacted to seek placement for this pt, with results as noted:   Beds available, information sent, decision pending: [facility]   Declined: [facility]     If this voluntary pt is accepted to a facility, please discuss disposition with pt to be sure that she agrees to the plan.  If a facility agrees to accept pt and the plan changes in any way please call the facility to inform them of the change.  Final disposition is pending as of this writing.   Doylene Canning, [place] Behavioral Health Coordinator [phone]

## 2021-08-31 NOTE — ED Notes (Addendum)
Pt got out of bed and was walking around in the dark in the room. Went to see if pt was okay, pt stated that she believes dead, and is happy and content. Pt proceeded to attempt to walk around the facility, guided the pt back into the room and explained to pt that she was alive. Got pt back into room, and into bed as pt began laughing.
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
## 2021-09-01 MED ORDER — OLANZAPINE 10 MG PO TBDP
10.0000 mg | ORAL_TABLET | Freq: Every day | ORAL | Status: DC
  Administered 2021-09-01: 10 mg via ORAL
  Filled 2021-09-01: qty 1

## 2021-09-01 NOTE — ED Notes (Signed)
Pt is currently calm, laying down watching tv.

## 2021-09-01 NOTE — ED Notes (Signed)
Pt took a shower and changed clothes.

## 2021-09-01 NOTE — ED Notes (Signed)
Pt is sitting quietly in her bed watching TV. Pt is alert, calm, and cooperative. AOx4. Pt denies SI, HI, AVH. Pt said she slept through the night. Pt said she feels heat and tingling in her body.

## 2021-09-01 NOTE — ED Notes (Signed)
Pt is currently eating lunch 

## 2021-09-01 NOTE — ED Notes (Signed)
Pt was crying. Asked pt is she was okay, pt stop crying and said that she was okay. Pt is currently laying in bed, watching tv.

## 2021-09-01 NOTE — ED Notes (Signed)
Pt is calm, watching tv in bed.

## 2021-11-01 ENCOUNTER — Ambulatory Visit: Payer: BLUE CROSS/BLUE SHIELD | Admitting: Internal Medicine

## 2021-11-29 DIAGNOSIS — R69 Illness, unspecified: Secondary | ICD-10-CM | POA: Diagnosis not present

## 2021-12-06 DIAGNOSIS — R69 Illness, unspecified: Secondary | ICD-10-CM | POA: Diagnosis not present

## 2021-12-13 DIAGNOSIS — R69 Illness, unspecified: Secondary | ICD-10-CM | POA: Diagnosis not present

## 2021-12-14 DIAGNOSIS — E119 Type 2 diabetes mellitus without complications: Secondary | ICD-10-CM | POA: Diagnosis not present

## 2021-12-20 DIAGNOSIS — R69 Illness, unspecified: Secondary | ICD-10-CM | POA: Diagnosis not present

## 2021-12-27 DIAGNOSIS — R69 Illness, unspecified: Secondary | ICD-10-CM | POA: Diagnosis not present

## 2022-01-10 DIAGNOSIS — R69 Illness, unspecified: Secondary | ICD-10-CM | POA: Diagnosis not present

## 2022-01-17 DIAGNOSIS — R69 Illness, unspecified: Secondary | ICD-10-CM | POA: Diagnosis not present

## 2022-01-24 DIAGNOSIS — R69 Illness, unspecified: Secondary | ICD-10-CM | POA: Diagnosis not present

## 2022-01-31 DIAGNOSIS — R69 Illness, unspecified: Secondary | ICD-10-CM | POA: Diagnosis not present

## 2022-02-07 DIAGNOSIS — R69 Illness, unspecified: Secondary | ICD-10-CM | POA: Diagnosis not present

## 2022-02-14 DIAGNOSIS — R69 Illness, unspecified: Secondary | ICD-10-CM | POA: Diagnosis not present

## 2022-02-21 DIAGNOSIS — R69 Illness, unspecified: Secondary | ICD-10-CM | POA: Diagnosis not present

## 2022-02-28 DIAGNOSIS — R69 Illness, unspecified: Secondary | ICD-10-CM | POA: Diagnosis not present

## 2022-03-14 DIAGNOSIS — R69 Illness, unspecified: Secondary | ICD-10-CM | POA: Diagnosis not present

## 2022-03-21 DIAGNOSIS — R69 Illness, unspecified: Secondary | ICD-10-CM | POA: Diagnosis not present

## 2022-03-28 DIAGNOSIS — R69 Illness, unspecified: Secondary | ICD-10-CM | POA: Diagnosis not present

## 2022-04-02 DIAGNOSIS — E785 Hyperlipidemia, unspecified: Secondary | ICD-10-CM | POA: Diagnosis not present

## 2022-04-02 DIAGNOSIS — E559 Vitamin D deficiency, unspecified: Secondary | ICD-10-CM | POA: Diagnosis not present

## 2022-04-02 DIAGNOSIS — I10 Essential (primary) hypertension: Secondary | ICD-10-CM | POA: Diagnosis not present

## 2022-04-04 DIAGNOSIS — R69 Illness, unspecified: Secondary | ICD-10-CM | POA: Diagnosis not present

## 2022-04-05 DIAGNOSIS — H40003 Preglaucoma, unspecified, bilateral: Secondary | ICD-10-CM | POA: Diagnosis not present

## 2022-04-11 DIAGNOSIS — R69 Illness, unspecified: Secondary | ICD-10-CM | POA: Diagnosis not present

## 2022-04-18 DIAGNOSIS — R69 Illness, unspecified: Secondary | ICD-10-CM | POA: Diagnosis not present

## 2022-04-25 DIAGNOSIS — R69 Illness, unspecified: Secondary | ICD-10-CM | POA: Diagnosis not present

## 2022-05-02 DIAGNOSIS — R69 Illness, unspecified: Secondary | ICD-10-CM | POA: Diagnosis not present

## 2022-05-04 ENCOUNTER — Other Ambulatory Visit: Payer: Self-pay | Admitting: Family Medicine

## 2022-05-04 DIAGNOSIS — Z1231 Encounter for screening mammogram for malignant neoplasm of breast: Secondary | ICD-10-CM

## 2022-05-09 DIAGNOSIS — R69 Illness, unspecified: Secondary | ICD-10-CM | POA: Diagnosis not present

## 2022-05-16 DIAGNOSIS — E039 Hypothyroidism, unspecified: Secondary | ICD-10-CM | POA: Diagnosis not present

## 2022-05-16 DIAGNOSIS — F411 Generalized anxiety disorder: Secondary | ICD-10-CM | POA: Diagnosis not present

## 2022-05-23 DIAGNOSIS — F411 Generalized anxiety disorder: Secondary | ICD-10-CM | POA: Diagnosis not present

## 2022-05-30 DIAGNOSIS — F411 Generalized anxiety disorder: Secondary | ICD-10-CM | POA: Diagnosis not present

## 2022-06-06 DIAGNOSIS — F411 Generalized anxiety disorder: Secondary | ICD-10-CM | POA: Diagnosis not present

## 2022-06-13 DIAGNOSIS — F411 Generalized anxiety disorder: Secondary | ICD-10-CM | POA: Diagnosis not present

## 2022-06-20 DIAGNOSIS — F411 Generalized anxiety disorder: Secondary | ICD-10-CM | POA: Diagnosis not present

## 2022-06-22 ENCOUNTER — Ambulatory Visit
Admission: RE | Admit: 2022-06-22 | Discharge: 2022-06-22 | Disposition: A | Discharge status: Home / Self Care | Payer: 59 | Source: Ambulatory Visit | Attending: Family Medicine | Admitting: Family Medicine

## 2022-06-22 DIAGNOSIS — Z1231 Encounter for screening mammogram for malignant neoplasm of breast: Secondary | ICD-10-CM | POA: Diagnosis not present

## 2022-06-26 DIAGNOSIS — G4700 Insomnia, unspecified: Secondary | ICD-10-CM | POA: Diagnosis not present

## 2022-06-27 DIAGNOSIS — F411 Generalized anxiety disorder: Secondary | ICD-10-CM | POA: Diagnosis not present

## 2022-07-04 DIAGNOSIS — E559 Vitamin D deficiency, unspecified: Secondary | ICD-10-CM | POA: Diagnosis not present

## 2022-07-04 DIAGNOSIS — F411 Generalized anxiety disorder: Secondary | ICD-10-CM | POA: Diagnosis not present

## 2022-07-09 DIAGNOSIS — F411 Generalized anxiety disorder: Secondary | ICD-10-CM | POA: Diagnosis not present

## 2022-07-25 DIAGNOSIS — F411 Generalized anxiety disorder: Secondary | ICD-10-CM | POA: Diagnosis not present

## 2022-07-31 DIAGNOSIS — G4700 Insomnia, unspecified: Secondary | ICD-10-CM | POA: Diagnosis not present

## 2022-07-31 DIAGNOSIS — M797 Fibromyalgia: Secondary | ICD-10-CM | POA: Diagnosis not present

## 2022-07-31 DIAGNOSIS — M549 Dorsalgia, unspecified: Secondary | ICD-10-CM | POA: Diagnosis not present

## 2022-07-31 DIAGNOSIS — M25561 Pain in right knee: Secondary | ICD-10-CM | POA: Diagnosis not present

## 2022-07-31 DIAGNOSIS — M25562 Pain in left knee: Secondary | ICD-10-CM | POA: Diagnosis not present

## 2022-08-01 DIAGNOSIS — F411 Generalized anxiety disorder: Secondary | ICD-10-CM | POA: Diagnosis not present

## 2022-08-03 DIAGNOSIS — M170 Bilateral primary osteoarthritis of knee: Secondary | ICD-10-CM | POA: Diagnosis not present

## 2022-08-03 DIAGNOSIS — M5416 Radiculopathy, lumbar region: Secondary | ICD-10-CM | POA: Diagnosis not present

## 2022-08-08 DIAGNOSIS — F411 Generalized anxiety disorder: Secondary | ICD-10-CM | POA: Diagnosis not present

## 2022-08-14 DIAGNOSIS — M25561 Pain in right knee: Secondary | ICD-10-CM | POA: Diagnosis not present

## 2022-08-14 DIAGNOSIS — M1711 Unilateral primary osteoarthritis, right knee: Secondary | ICD-10-CM | POA: Diagnosis not present

## 2022-08-15 DIAGNOSIS — F411 Generalized anxiety disorder: Secondary | ICD-10-CM | POA: Diagnosis not present

## 2022-08-16 DIAGNOSIS — F418 Other specified anxiety disorders: Secondary | ICD-10-CM | POA: Diagnosis not present

## 2022-08-21 DIAGNOSIS — M1711 Unilateral primary osteoarthritis, right knee: Secondary | ICD-10-CM | POA: Diagnosis not present

## 2022-08-21 DIAGNOSIS — M25561 Pain in right knee: Secondary | ICD-10-CM | POA: Diagnosis not present

## 2022-08-22 DIAGNOSIS — F411 Generalized anxiety disorder: Secondary | ICD-10-CM | POA: Diagnosis not present

## 2022-08-28 DIAGNOSIS — M25561 Pain in right knee: Secondary | ICD-10-CM | POA: Diagnosis not present

## 2022-08-28 DIAGNOSIS — M1711 Unilateral primary osteoarthritis, right knee: Secondary | ICD-10-CM | POA: Diagnosis not present

## 2022-08-29 DIAGNOSIS — F411 Generalized anxiety disorder: Secondary | ICD-10-CM | POA: Diagnosis not present

## 2022-08-31 DIAGNOSIS — M170 Bilateral primary osteoarthritis of knee: Secondary | ICD-10-CM | POA: Diagnosis not present

## 2022-08-31 DIAGNOSIS — M5416 Radiculopathy, lumbar region: Secondary | ICD-10-CM | POA: Diagnosis not present

## 2022-09-04 DIAGNOSIS — M1711 Unilateral primary osteoarthritis, right knee: Secondary | ICD-10-CM | POA: Diagnosis not present

## 2022-09-04 DIAGNOSIS — M25561 Pain in right knee: Secondary | ICD-10-CM | POA: Diagnosis not present

## 2022-09-05 DIAGNOSIS — F411 Generalized anxiety disorder: Secondary | ICD-10-CM | POA: Diagnosis not present

## 2022-09-11 DIAGNOSIS — M1711 Unilateral primary osteoarthritis, right knee: Secondary | ICD-10-CM | POA: Diagnosis not present

## 2022-09-11 DIAGNOSIS — M25561 Pain in right knee: Secondary | ICD-10-CM | POA: Diagnosis not present

## 2022-09-12 DIAGNOSIS — F411 Generalized anxiety disorder: Secondary | ICD-10-CM | POA: Diagnosis not present

## 2022-09-17 DIAGNOSIS — M1711 Unilateral primary osteoarthritis, right knee: Secondary | ICD-10-CM | POA: Diagnosis not present

## 2022-09-17 DIAGNOSIS — M25561 Pain in right knee: Secondary | ICD-10-CM | POA: Diagnosis not present

## 2022-09-19 DIAGNOSIS — F411 Generalized anxiety disorder: Secondary | ICD-10-CM | POA: Diagnosis not present

## 2022-09-23 DIAGNOSIS — M542 Cervicalgia: Secondary | ICD-10-CM | POA: Diagnosis not present

## 2022-09-23 DIAGNOSIS — F4310 Post-traumatic stress disorder, unspecified: Secondary | ICD-10-CM | POA: Diagnosis not present

## 2022-09-23 DIAGNOSIS — M545 Low back pain, unspecified: Secondary | ICD-10-CM | POA: Diagnosis not present

## 2022-09-23 DIAGNOSIS — M546 Pain in thoracic spine: Secondary | ICD-10-CM | POA: Diagnosis not present

## 2022-09-23 DIAGNOSIS — M25572 Pain in left ankle and joints of left foot: Secondary | ICD-10-CM | POA: Diagnosis not present

## 2022-09-25 DIAGNOSIS — G4700 Insomnia, unspecified: Secondary | ICD-10-CM | POA: Diagnosis not present

## 2022-09-25 DIAGNOSIS — S301XXD Contusion of abdominal wall, subsequent encounter: Secondary | ICD-10-CM | POA: Diagnosis not present

## 2022-09-26 DIAGNOSIS — F411 Generalized anxiety disorder: Secondary | ICD-10-CM | POA: Diagnosis not present

## 2022-09-28 DIAGNOSIS — M1711 Unilateral primary osteoarthritis, right knee: Secondary | ICD-10-CM | POA: Diagnosis not present

## 2022-09-28 DIAGNOSIS — M25561 Pain in right knee: Secondary | ICD-10-CM | POA: Diagnosis not present

## 2022-10-01 DIAGNOSIS — M170 Bilateral primary osteoarthritis of knee: Secondary | ICD-10-CM | POA: Diagnosis not present

## 2022-10-03 DIAGNOSIS — F411 Generalized anxiety disorder: Secondary | ICD-10-CM | POA: Diagnosis not present

## 2022-10-03 NOTE — Progress Notes (Unsigned)
    Kendra Dodson D.[facility] [address] [place] [address] Phone: [phone]   Assessment and Plan:     There are no diagnoses linked to this encounter.  ***   Pertinent previous records reviewed include ***   Follow Up: ***     Subjective:   I, Kendra Dodson, am serving as a Neurosurgeon for Doctor Kendra Dodson  Chief Complaint: multiple musculoskeletal pain   HPI:   10/04/2022 Patient is a 62 year old female complaining of multiple musculoskeletal pains. Patient states  Relevant Historical Information: ***  Additional pertinent review of systems negative.   Current Outpatient Medications:    atorvastatin (LIPITOR) 40 MG tablet, Take 1 tablet (40 mg total) by mouth daily., Disp: 90 tablet, Rfl: 3   famotidine (PEPCID) 40 MG tablet, Take 1 tablet (40 mg total) by mouth at bedtime for 5 days., Disp: 5 tablet, Rfl: 0   hydrOXYzine (VISTARIL) 25 MG capsule, Take 1 capsule (25 mg total) by mouth every 8 (eight) hours as needed. (Patient taking differently: Take 25 mg by mouth every 8 (eight) hours as needed for itching.), Disp: 30 capsule, Rfl: 0   levothyroxine (SYNTHROID) 75 MCG tablet, Take 1 tablet (75 mcg total) by mouth daily., Disp: 90 tablet, Rfl: 1   lisinopril (ZESTRIL) 5 MG tablet, Take 1 tablet (5 mg total) by mouth daily., Disp: 30 tablet, Rfl: 11   predniSONE (DELTASONE) 20 MG tablet, Take 20 mg by mouth See admin instructions. Take 1 tablet by mouth for 5 days for a rash from [facility], Disp: , Rfl:    Objective:     There were no vitals filed for this visit.    There is no height or weight on file to calculate BMI.    Physical Exam:    ***   Electronically signed by:  Kendra Dodson D.[facility] 12:24 PM 10/03/22

## 2022-10-04 ENCOUNTER — Ambulatory Visit: Payer: 59 | Admitting: Sports Medicine

## 2022-10-04 DIAGNOSIS — M545 Low back pain, unspecified: Secondary | ICD-10-CM | POA: Diagnosis not present

## 2022-10-04 DIAGNOSIS — M542 Cervicalgia: Secondary | ICD-10-CM | POA: Diagnosis not present

## 2022-10-04 DIAGNOSIS — G8929 Other chronic pain: Secondary | ICD-10-CM

## 2022-10-04 DIAGNOSIS — M546 Pain in thoracic spine: Secondary | ICD-10-CM | POA: Diagnosis not present

## 2022-10-04 MED ORDER — CYCLOBENZAPRINE HCL 5 MG PO TABS: 5.0000 mg | ORAL_TABLET | Freq: Three times a day (TID) | ORAL | 0 refills | Status: DC | PRN

## 2022-10-04 MED ORDER — MELOXICAM 15 MG PO TABS: 15.0000 mg | ORAL_TABLET | Freq: Every day | ORAL | 0 refills | Status: DC

## 2022-10-04 NOTE — Patient Instructions (Signed)
-   Start meloxicam 15 mg daily x2 weeks.  If still having pain after 2 weeks, complete 3rd-week of meloxicam. May use remaining meloxicam as needed once daily for pain control.  Do not to use additional NSAIDs while taking meloxicam.  May use Tylenol [phone] mg 2 to 3 times a day for breakthrough pain. Discontinue diclofenac Flexeril 5 mg nightly as needed for muscle spasm  Neck and back HEP  3-4 week follow up

## 2022-10-10 ENCOUNTER — Telehealth: Payer: Self-pay

## 2022-10-10 DIAGNOSIS — F411 Generalized anxiety disorder: Secondary | ICD-10-CM | POA: Diagnosis not present

## 2022-10-10 NOTE — Telephone Encounter (Signed)
Reached out to patient to set up follow up visit with provider to discuss chronic conditions.  Telephone encounter attempt : 1   A HIPAA compliant voice message was left requesting a return call.  Instructed patient to call office or to call me at [phone].  Elijio Miles [facility] Health Specialist

## 2022-10-16 DIAGNOSIS — M170 Bilateral primary osteoarthritis of knee: Secondary | ICD-10-CM | POA: Diagnosis not present

## 2022-10-16 DIAGNOSIS — M5416 Radiculopathy, lumbar region: Secondary | ICD-10-CM | POA: Diagnosis not present

## 2022-10-17 ENCOUNTER — Other Ambulatory Visit: Payer: Self-pay | Admitting: Family Medicine

## 2022-10-17 DIAGNOSIS — M5416 Radiculopathy, lumbar region: Secondary | ICD-10-CM

## 2022-10-17 DIAGNOSIS — M545 Low back pain, unspecified: Secondary | ICD-10-CM

## 2022-10-17 DIAGNOSIS — F411 Generalized anxiety disorder: Secondary | ICD-10-CM | POA: Diagnosis not present

## 2022-10-24 DIAGNOSIS — F411 Generalized anxiety disorder: Secondary | ICD-10-CM | POA: Diagnosis not present
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
## 2022-10-25 ENCOUNTER — Ambulatory Visit: Payer: 59 | Admitting: Sports Medicine

## 2022-10-25 ENCOUNTER — Other Ambulatory Visit: Payer: Self-pay

## 2022-10-25 ENCOUNTER — Ambulatory Visit (INDEPENDENT_AMBULATORY_CARE_PROVIDER_SITE_OTHER): Payer: 59

## 2022-10-25 DIAGNOSIS — Z0189 Encounter for other specified special examinations: Secondary | ICD-10-CM | POA: Diagnosis not present

## 2022-10-25 DIAGNOSIS — M25572 Pain in left ankle and joints of left foot: Secondary | ICD-10-CM

## 2022-10-25 DIAGNOSIS — M25511 Pain in right shoulder: Secondary | ICD-10-CM

## 2022-10-25 DIAGNOSIS — G8929 Other chronic pain: Secondary | ICD-10-CM

## 2022-10-25 DIAGNOSIS — M19072 Primary osteoarthritis, left ankle and foot: Secondary | ICD-10-CM | POA: Diagnosis not present

## 2022-10-25 NOTE — Patient Instructions (Signed)
Pt referral  4 week follow up

## 2022-10-31 ENCOUNTER — Other Ambulatory Visit: Payer: Self-pay | Admitting: Sports Medicine

## 2022-10-31 DIAGNOSIS — F411 Generalized anxiety disorder: Secondary | ICD-10-CM | POA: Diagnosis not present

## 2022-11-01 DIAGNOSIS — F4310 Post-traumatic stress disorder, unspecified: Secondary | ICD-10-CM | POA: Diagnosis not present

## 2022-11-01 DIAGNOSIS — J0191 Acute recurrent sinusitis, unspecified: Secondary | ICD-10-CM | POA: Diagnosis not present

## 2022-11-01 DIAGNOSIS — I10 Essential (primary) hypertension: Secondary | ICD-10-CM | POA: Diagnosis not present

## 2022-11-01 DIAGNOSIS — E785 Hyperlipidemia, unspecified: Secondary | ICD-10-CM | POA: Diagnosis not present

## 2022-11-01 DIAGNOSIS — M797 Fibromyalgia: Secondary | ICD-10-CM | POA: Diagnosis not present

## 2022-11-01 DIAGNOSIS — G4700 Insomnia, unspecified: Secondary | ICD-10-CM | POA: Diagnosis not present

## 2022-11-01 DIAGNOSIS — F418 Other specified anxiety disorders: Secondary | ICD-10-CM | POA: Diagnosis not present

## 2022-11-01 DIAGNOSIS — E039 Hypothyroidism, unspecified: Secondary | ICD-10-CM | POA: Diagnosis not present

## 2022-11-07 DIAGNOSIS — F411 Generalized anxiety disorder: Secondary | ICD-10-CM | POA: Diagnosis not present

## 2022-11-10 ENCOUNTER — Other Ambulatory Visit: Payer: 59

## 2022-11-14 DIAGNOSIS — F411 Generalized anxiety disorder: Secondary | ICD-10-CM | POA: Diagnosis not present

## 2022-11-14 NOTE — Therapy (Signed)
OUTPATIENT PHYSICAL THERAPY SHOULDER EVALUATION   Patient Name: Kendra Dodson MRN: [id number] DOB:09-17-1960, 62 y.o., female Today's Date: 11/14/2022  END OF SESSION:   Past Medical History:  Diagnosis Date   Asthma    Fibromyalgia    Hypothyroid    Migraines    Past Surgical History:  Procedure Laterality Date   ADENOIDECTOMY     CHOLECYSTECTOMY     Patient Active Problem List   Diagnosis Date Noted   Hypothyroidism 10/26/2019    PCP: Kathrynn Speed, NP  REFERRING PROVIDER: Richardean Sale, DO  REFERRING DIAG: [phone] (ICD-10-CM) - Left ankle pain, unspecified chronicity M25.511 (ICD-10-CM) - Pain in joint of right shoulder  THERAPY DIAG:  No diagnosis found.  Rationale for Evaluation and Treatment: Rehabilitation  ONSET DATE: ***  SUBJECTIVE:                                                                                                                                                                                      SUBJECTIVE STATEMENT: *** Hand dominance: {MISC; OT HAND DOMINANCE:[phone]}  PERTINENT HISTORY: asthma, fibromyalgia, migraines, hypothyroidism  PAIN:  Are you having pain: *** Location/description: *** Best-worst over past week: ***  - aggravating factors: *** - Easing factors: ***    PRECAUTIONS: {Therapy precautions:24002}  WEIGHT BEARING RESTRICTIONS: No  FALLS:  Has patient fallen in last 6 months? {fallsyesno:27318}  LIVING ENVIRONMENT: Lives with: {[facility] lives with:25569::"lives with their family"} Lives in: {Lives in:25570} Stairs: {opstairs:27293} Has following equipment at home: {Assistive devices:23999}  OCCUPATION: ***  PLOF: {PLOF:24004}  PATIENT GOALS:***  NEXT MD VISIT:   OBJECTIVE:   DIAGNOSTIC FINDINGS:  11/01/22 R shoulder XR (per epic) "IMPRESSION: No acute fracture or dislocation."  11/01/22 L ankle XR (per EPIC) "IMPRESSION: 1. No acute fracture or dislocation."  PATIENT SURVEYS:   FOTO ***  COGNITION: Overall cognitive status: {cognition:24006}     SENSATION: {sensation:27233}  POSTURE: ***  RANGE OF MOTION:     Active  Right eval Left eval  Shoulder flexion    Functional ER combo    Functional IR combo    Knee extension    Ankle dorsiflexion    Ankle inversion    Ankle eversion     (Blank rows = not tested) (Key: WFL = within functional limits not formally assessed, * = concordant pain, s = stiffness/stretching sensation, NT = not tested)  Comments:    STRENGTH TESTING:  MMT Right eval Left eval  Shoulder flexion    Shoulder abduction    Shoulder internal rotation    Shoulder external rotation    Elbow flexion    Elbow extension    Grip strength (gross)  Hip flexion    Hip abduction (modified sitting)    Knee flexion    Knee extension    Ankle dorsiflexion    Ankle plantarflexion    Ankle inversion    Ankle eversion     (Blank rows = not tested) (Key: WFL = within functional limits not formally assessed, * = concordant pain, s = stiffness/stretching sensation, NT = not tested)  Comments:    SHOULDER SPECIAL TESTS: Impingement tests: {shoulder impingement test:25231:a} SLAP lesions: {SLAP lesions:25232} Instability tests: {shoulder instability test:25233} Rotator cuff assessment: {rotator cuff assessment:25234} Biceps assessment: {biceps assessment:25235}  JOINT MOBILITY TESTING:  ***  PALPATION:  ***   TODAY'S TREATMENT:                                                                                                                                         [facility] Adult PT Treatment:                                                DATE: 11/15/22 Therapeutic Exercise: *** Manual Therapy: *** Neuromuscular re-ed: *** Therapeutic Activity: *** Modalities: *** Self Care: ***    PATIENT EDUCATION: Education details: Pt education on PT impairments, prognosis, and POC. Informed consent. Rationale for interventions,  safe/appropriate HEP performance Person educated: Patient Education method: Explanation, Demonstration, Tactile cues, Verbal cues, and Handouts Education comprehension: verbalized understanding, returned demonstration, verbal cues required, tactile cues required, and needs further education    HOME EXERCISE PROGRAM: ***  ASSESSMENT:  CLINICAL IMPRESSION: Patient is a 62 y.o. woman who was seen today for physical therapy evaluation and treatment for L ankle and R shoulder pain. ***    OBJECTIVE IMPAIRMENTS: {opptimpairments:25111}.   ACTIVITY LIMITATIONS: {activitylimitations:27494}  PARTICIPATION LIMITATIONS: {participationrestrictions:25113}  PERSONAL FACTORS: Time since onset of injury/illness/exacerbation and 3+ comorbidities: asthma, fibromyalgia, migraines, and multiple treatment areas  are also affecting patient's functional outcome.   REHAB POTENTIAL: {rehabpotential:25112}  CLINICAL DECISION MAKING: {clinical decision making:25114}  EVALUATION COMPLEXITY: {Evaluation complexity:25115}   GOALS: Goals reviewed with patient? {yes/no:20286}  SHORT TERM GOALS: Target date: ***  Pt will demonstrate appropriate understanding and performance of initially prescribed HEP in order to facilitate improved independence with management of symptoms.  Baseline: HEP provided on eval Goal status: INITIAL   2. Pt will score greater than or equal to *** on FOTO in order to demonstrate improved perception of function due to symptoms.  Baseline: ***  Goal status: INITIAL    LONG TERM GOALS: Target date: *** Pt will score *** on FOTO in order to demonstrate improved perception of function due to symptoms. Baseline: *** Goal status: INITIAL  2.  Pt will demonstrate at least *** degrees of active shoulder elevation in order to demonstrate improved tolerance to functional movement patterns  such as ***.  Baseline: see ROM chart above Goal status: INITIAL  3.  Pt will demonstrate at  least 4+/5 shoulder *** MMT for improved symmetry of UE strength and improved tolerance to functional movements.  Baseline: see MMT chart above Goal status: INITIAL  4. Pt will report/demonstrate ability to perform *** with less than 2 point increase in pain on NPS in order to indicate improved tolerance/independence with functional tasks such as ***.   Baseline: ***  Goal status: INITIAL   PLAN:  PT FREQUENCY: {rehab frequency:25116}  PT DURATION: {rehab duration:25117}  PLANNED INTERVENTIONS: {rehab planned interventions:25118::"Therapeutic exercises","Therapeutic activity","Neuromuscular re-education","Balance training","Gait training","Patient/Family education","Self Care","Joint mobilization"}  PLAN FOR NEXT SESSION: Review/update HEP PRN. Work on [employer] exercises as appropriate with emphasis on ***. Symptom modification strategies as indicated/appropriate.    Ashley Murrain PT, DPT 11/14/2022 9:07 AM

## 2022-11-15 ENCOUNTER — Other Ambulatory Visit: Payer: Self-pay

## 2022-11-15 ENCOUNTER — Encounter: Payer: Self-pay | Admitting: Physical Therapy

## 2022-11-15 ENCOUNTER — Ambulatory Visit: Payer: 59 | Attending: Sports Medicine | Admitting: Physical Therapy

## 2022-11-15 DIAGNOSIS — M25572 Pain in left ankle and joints of left foot: Secondary | ICD-10-CM | POA: Diagnosis not present

## 2022-11-15 DIAGNOSIS — M6281 Muscle weakness (generalized): Secondary | ICD-10-CM | POA: Diagnosis not present

## 2022-11-15 DIAGNOSIS — G8929 Other chronic pain: Secondary | ICD-10-CM | POA: Insufficient documentation

## 2022-11-15 DIAGNOSIS — M25511 Pain in right shoulder: Secondary | ICD-10-CM | POA: Insufficient documentation

## 2022-11-21 DIAGNOSIS — M170 Bilateral primary osteoarthritis of knee: Secondary | ICD-10-CM | POA: Diagnosis not present

## 2022-11-21 DIAGNOSIS — F411 Generalized anxiety disorder: Secondary | ICD-10-CM | POA: Diagnosis not present

## 2022-11-21 DIAGNOSIS — M5416 Radiculopathy, lumbar region: Secondary | ICD-10-CM | POA: Diagnosis not present
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
## 2022-11-22 ENCOUNTER — Ambulatory Visit: Payer: 59 | Admitting: Sports Medicine

## 2022-11-22 DIAGNOSIS — G8929 Other chronic pain: Secondary | ICD-10-CM

## 2022-11-22 DIAGNOSIS — M25511 Pain in right shoulder: Secondary | ICD-10-CM

## 2022-11-22 DIAGNOSIS — S46811A Strain of other muscles, fascia and tendons at shoulder and upper arm level, right arm, initial encounter: Secondary | ICD-10-CM

## 2022-11-22 DIAGNOSIS — M25572 Pain in left ankle and joints of left foot: Secondary | ICD-10-CM

## 2022-11-22 NOTE — Patient Instructions (Signed)
Tylenol [phone] mg 2-3 times a day for pain relief  Voltaren gel 1-2 times per day over areas of pain  Continue PT and trap HEP 4 week follow up

## 2022-11-23 ENCOUNTER — Ambulatory Visit: Payer: 59 | Admitting: Physical Therapy

## 2022-11-23 ENCOUNTER — Encounter: Payer: Self-pay | Admitting: Physical Therapy

## 2022-11-23 DIAGNOSIS — M25511 Pain in right shoulder: Secondary | ICD-10-CM

## 2022-11-23 DIAGNOSIS — M6281 Muscle weakness (generalized): Secondary | ICD-10-CM

## 2022-11-23 DIAGNOSIS — M25572 Pain in left ankle and joints of left foot: Secondary | ICD-10-CM

## 2022-11-23 DIAGNOSIS — G8929 Other chronic pain: Secondary | ICD-10-CM | POA: Diagnosis not present
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
## 2022-11-26 ENCOUNTER — Encounter: Payer: Self-pay | Admitting: Physical Therapy

## 2022-11-26 ENCOUNTER — Ambulatory Visit: Payer: 59 | Admitting: Physical Therapy

## 2022-11-26 DIAGNOSIS — M25572 Pain in left ankle and joints of left foot: Secondary | ICD-10-CM

## 2022-11-26 DIAGNOSIS — G8929 Other chronic pain: Secondary | ICD-10-CM | POA: Diagnosis not present

## 2022-11-26 DIAGNOSIS — M25511 Pain in right shoulder: Secondary | ICD-10-CM | POA: Diagnosis not present

## 2022-11-26 DIAGNOSIS — M6281 Muscle weakness (generalized): Secondary | ICD-10-CM | POA: Diagnosis not present
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
## 2022-11-28 ENCOUNTER — Encounter: Payer: Self-pay | Admitting: Physical Therapy

## 2022-11-28 ENCOUNTER — Ambulatory Visit: Payer: 59 | Admitting: Physical Therapy

## 2022-11-28 DIAGNOSIS — M6281 Muscle weakness (generalized): Secondary | ICD-10-CM | POA: Diagnosis not present

## 2022-11-28 DIAGNOSIS — F411 Generalized anxiety disorder: Secondary | ICD-10-CM | POA: Diagnosis not present

## 2022-11-28 DIAGNOSIS — M25572 Pain in left ankle and joints of left foot: Secondary | ICD-10-CM | POA: Diagnosis not present

## 2022-11-28 DIAGNOSIS — M25511 Pain in right shoulder: Secondary | ICD-10-CM

## 2022-11-28 DIAGNOSIS — G8929 Other chronic pain: Secondary | ICD-10-CM | POA: Diagnosis not present

## 2022-11-29 DIAGNOSIS — M170 Bilateral primary osteoarthritis of knee: Secondary | ICD-10-CM | POA: Diagnosis not present
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
## 2022-12-03 ENCOUNTER — Ambulatory Visit: Payer: 59 | Admitting: Physical Therapy

## 2022-12-03 ENCOUNTER — Encounter: Payer: Self-pay | Admitting: Physical Therapy

## 2022-12-03 DIAGNOSIS — M25572 Pain in left ankle and joints of left foot: Secondary | ICD-10-CM

## 2022-12-03 DIAGNOSIS — G8929 Other chronic pain: Secondary | ICD-10-CM | POA: Diagnosis not present

## 2022-12-03 DIAGNOSIS — M6281 Muscle weakness (generalized): Secondary | ICD-10-CM | POA: Diagnosis not present

## 2022-12-03 DIAGNOSIS — Z01818 Encounter for other preprocedural examination: Secondary | ICD-10-CM | POA: Diagnosis not present

## 2022-12-03 DIAGNOSIS — M25511 Pain in right shoulder: Secondary | ICD-10-CM

## 2022-12-04 ENCOUNTER — Encounter: Payer: Self-pay | Admitting: Family Medicine
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
## 2022-12-05 ENCOUNTER — Ambulatory Visit: Payer: 59 | Admitting: Physical Therapy

## 2022-12-05 ENCOUNTER — Encounter: Payer: Self-pay | Admitting: Physical Therapy

## 2022-12-05 DIAGNOSIS — M25511 Pain in right shoulder: Secondary | ICD-10-CM | POA: Diagnosis not present

## 2022-12-05 DIAGNOSIS — M6281 Muscle weakness (generalized): Secondary | ICD-10-CM

## 2022-12-05 DIAGNOSIS — M25572 Pain in left ankle and joints of left foot: Secondary | ICD-10-CM | POA: Diagnosis not present

## 2022-12-05 DIAGNOSIS — G8929 Other chronic pain: Secondary | ICD-10-CM | POA: Diagnosis not present

## 2022-12-05 DIAGNOSIS — F411 Generalized anxiety disorder: Secondary | ICD-10-CM | POA: Diagnosis not present
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
## 2022-12-09 ENCOUNTER — Ambulatory Visit
Admission: RE | Admit: 2022-12-09 | Discharge: 2022-12-09 | Disposition: A | Discharge status: Home / Self Care | Payer: 59 | Source: Ambulatory Visit | Attending: Family Medicine | Admitting: Family Medicine

## 2022-12-09 DIAGNOSIS — M545 Low back pain, unspecified: Secondary | ICD-10-CM

## 2022-12-09 DIAGNOSIS — M5416 Radiculopathy, lumbar region: Secondary | ICD-10-CM

## 2022-12-10 ENCOUNTER — Encounter: Payer: Self-pay | Admitting: Physical Therapy

## 2022-12-10 ENCOUNTER — Ambulatory Visit: Payer: 59 | Admitting: Physical Therapy

## 2022-12-10 DIAGNOSIS — M25572 Pain in left ankle and joints of left foot: Secondary | ICD-10-CM | POA: Diagnosis not present

## 2022-12-10 DIAGNOSIS — G8929 Other chronic pain: Secondary | ICD-10-CM | POA: Diagnosis not present

## 2022-12-10 DIAGNOSIS — M6281 Muscle weakness (generalized): Secondary | ICD-10-CM

## 2022-12-10 DIAGNOSIS — R202 Paresthesia of skin: Secondary | ICD-10-CM | POA: Diagnosis not present

## 2022-12-10 DIAGNOSIS — M25511 Pain in right shoulder: Secondary | ICD-10-CM | POA: Diagnosis not present

## 2022-12-10 DIAGNOSIS — M4316 Spondylolisthesis, lumbar region: Secondary | ICD-10-CM | POA: Diagnosis not present

## 2022-12-12 ENCOUNTER — Ambulatory Visit: Payer: 59 | Admitting: Physical Therapy

## 2022-12-12 DIAGNOSIS — F411 Generalized anxiety disorder: Secondary | ICD-10-CM | POA: Diagnosis not present

## 2022-12-13 DIAGNOSIS — M25561 Pain in right knee: Secondary | ICD-10-CM | POA: Diagnosis not present

## 2022-12-13 DIAGNOSIS — M1711 Unilateral primary osteoarthritis, right knee: Secondary | ICD-10-CM | POA: Diagnosis not present

## 2022-12-14 DIAGNOSIS — H40022 Open angle with borderline findings, high risk, left eye: Secondary | ICD-10-CM | POA: Diagnosis not present

## 2022-12-14 DIAGNOSIS — H2511 Age-related nuclear cataract, right eye: Secondary | ICD-10-CM | POA: Diagnosis not present

## 2022-12-19 DIAGNOSIS — F411 Generalized anxiety disorder: Secondary | ICD-10-CM | POA: Diagnosis not present

## 2022-12-20 ENCOUNTER — Ambulatory Visit: Payer: 59 | Admitting: Sports Medicine

## 2022-12-21 DIAGNOSIS — M1711 Unilateral primary osteoarthritis, right knee: Secondary | ICD-10-CM | POA: Diagnosis not present

## 2022-12-21 DIAGNOSIS — G8918 Other acute postprocedural pain: Secondary | ICD-10-CM | POA: Diagnosis not present

## 2022-12-26 DIAGNOSIS — F411 Generalized anxiety disorder: Secondary | ICD-10-CM | POA: Diagnosis not present

## 2023-01-01 DIAGNOSIS — M1711 Unilateral primary osteoarthritis, right knee: Secondary | ICD-10-CM | POA: Diagnosis not present

## 2023-01-01 DIAGNOSIS — M25561 Pain in right knee: Secondary | ICD-10-CM | POA: Diagnosis not present

## 2023-01-02 DIAGNOSIS — F411 Generalized anxiety disorder: Secondary | ICD-10-CM | POA: Diagnosis not present

## 2023-01-08 DIAGNOSIS — M25561 Pain in right knee: Secondary | ICD-10-CM | POA: Diagnosis not present

## 2023-01-08 DIAGNOSIS — M1711 Unilateral primary osteoarthritis, right knee: Secondary | ICD-10-CM | POA: Diagnosis not present

## 2023-01-09 DIAGNOSIS — F411 Generalized anxiety disorder: Secondary | ICD-10-CM | POA: Diagnosis not present

## 2023-01-15 DIAGNOSIS — M1711 Unilateral primary osteoarthritis, right knee: Secondary | ICD-10-CM | POA: Diagnosis not present

## 2023-01-15 DIAGNOSIS — M25561 Pain in right knee: Secondary | ICD-10-CM | POA: Diagnosis not present

## 2023-01-22 DIAGNOSIS — M1711 Unilateral primary osteoarthritis, right knee: Secondary | ICD-10-CM | POA: Diagnosis not present

## 2023-01-22 DIAGNOSIS — M25561 Pain in right knee: Secondary | ICD-10-CM | POA: Diagnosis not present

## 2023-01-23 DIAGNOSIS — F411 Generalized anxiety disorder: Secondary | ICD-10-CM | POA: Diagnosis not present

## 2023-01-25 DIAGNOSIS — M5416 Radiculopathy, lumbar region: Secondary | ICD-10-CM | POA: Diagnosis not present

## 2023-01-28 DIAGNOSIS — M25561 Pain in right knee: Secondary | ICD-10-CM | POA: Diagnosis not present

## 2023-01-28 DIAGNOSIS — M1711 Unilateral primary osteoarthritis, right knee: Secondary | ICD-10-CM | POA: Diagnosis not present

## 2023-01-29 DIAGNOSIS — M1712 Unilateral primary osteoarthritis, left knee: Secondary | ICD-10-CM | POA: Diagnosis not present

## 2023-01-29 DIAGNOSIS — M25562 Pain in left knee: Secondary | ICD-10-CM | POA: Diagnosis not present

## 2023-01-30 DIAGNOSIS — F411 Generalized anxiety disorder: Secondary | ICD-10-CM | POA: Diagnosis not present

## 2023-02-06 DIAGNOSIS — F411 Generalized anxiety disorder: Secondary | ICD-10-CM | POA: Diagnosis not present

## 2023-02-13 DIAGNOSIS — F411 Generalized anxiety disorder: Secondary | ICD-10-CM | POA: Diagnosis not present

## 2023-02-14 DIAGNOSIS — M25562 Pain in left knee: Secondary | ICD-10-CM | POA: Diagnosis not present

## 2023-02-18 DIAGNOSIS — J0100 Acute maxillary sinusitis, unspecified: Secondary | ICD-10-CM | POA: Diagnosis not present

## 2023-02-20 DIAGNOSIS — F411 Generalized anxiety disorder: Secondary | ICD-10-CM | POA: Diagnosis not present

## 2023-02-21 DIAGNOSIS — M5416 Radiculopathy, lumbar region: Secondary | ICD-10-CM | POA: Diagnosis not present

## 2023-02-27 DIAGNOSIS — F411 Generalized anxiety disorder: Secondary | ICD-10-CM | POA: Diagnosis not present

## 2023-03-08 DIAGNOSIS — M1712 Unilateral primary osteoarthritis, left knee: Secondary | ICD-10-CM | POA: Diagnosis not present

## 2023-03-08 DIAGNOSIS — G8918 Other acute postprocedural pain: Secondary | ICD-10-CM | POA: Diagnosis not present

## 2023-03-08 DIAGNOSIS — M25762 Osteophyte, left knee: Secondary | ICD-10-CM | POA: Diagnosis not present

## 2023-03-12 DIAGNOSIS — M1712 Unilateral primary osteoarthritis, left knee: Secondary | ICD-10-CM | POA: Diagnosis not present

## 2023-09-17 NOTE — Therapy (Signed)
 OUTPATIENT PHYSICAL THERAPY LOWER EXTREMITY EVALUATION   Patient Name: Kendra Dodson MRN: [id number] DOB:1960/10/17, 63 y.o., female Today's Date: 09/18/2023  END OF SESSION:  PT End of Session - 09/18/23 0932     Visit Number 1    Date for PT Re-Evaluation 12/11/23    Authorization Type [employer]    PT Start Time 0930    PT Stop Time 1015    PT Time Calculation (min) 45 min          Past Medical History:  Diagnosis Date   Asthma    Fibromyalgia    Hypothyroid    Migraines    Past Surgical History:  Procedure Laterality Date   ADENOIDECTOMY     CHOLECYSTECTOMY     Patient Active Problem List   Diagnosis Date Noted   Hypothyroidism 10/26/2019    PCP: Christain Asp  REFERRING PROVIDER: Toribio Higashi  REFERRING DIAG: R ITB Syndrome  THERAPY DIAG:  Chronic pain of both knees  Other low back pain  Muscle weakness (generalized)  Rationale for Evaluation and Treatment: Rehabilitation  ONSET DATE: 08/30/23 referral  SUBJECTIVE:   SUBJECTIVE STATEMENT: I had both of my knees done last year. I was walking with the cane and then stopped but now I am started again. This is because my right knee gave out twice. A month ago I fell on my R knee while out in the yard. I have some pain in my back too and they have been giving me shots. Last one was last month.   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 Yes: NPRS scale: 4/10 Pain location: low back and knees  Pain description: chronic, ache, sometimes can be sharp in the knees, pin pricks in the knee Aggravating factors: standing more than 10 mins, doing too much activity  Relieving factors: nothing really  PRECAUTIONS: None  RED FLAGS: None   WEIGHT BEARING RESTRICTIONS: No  FALLS:  Has patient fallen in last 6 months? Yes. 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  DIAGNOSTIC FINDINGS: Grade 1 anterolisthesis of L5 on S1 with moderate spinal canal narrowing, severe narrowing of the bilateral lateral recesses, and moderate bilateral neural foraminal narrowing.    PATIENT SURVEYS:  LEFS 20/80  COGNITION: Overall cognitive status: Within functional limits for tasks assessed     SENSATION: WFL Light touch: left lateral foot is numb all the time   PALPATION: Some TTP right low back  LUMBAR ROM: Flexion- WFL some pain going down and coming up on R side Extension- 75% with pain  Right lateral flexion- WNL Left lateral flexion- WNL Right rotation- WNL Left rotation- WNL  LOWER EXTREMITY ROM: WFL bilateral    LOWER EXTREMITY MMT:  MMT Right eval Left eval  Hip flexion 4- w/pain 4-  Hip extension    Hip abduction    Hip adduction    Hip  internal rotation    Hip external rotation    Knee flexion 4 4  Knee extension 3+ 3+  Ankle dorsiflexion    Ankle plantarflexion    Ankle inversion    Ankle eversion     (Blank rows = not tested)   FUNCTIONAL TESTS:  5 times sit to stand: 15s from chair but with difficulty  Timed up and go (TUG): 11.65s  GAIT: Distance walked: in clinic distances Assistive device utilized: Single point cane Level of assistance: Complete Independence                                                                                                                               TREATMENT DATE: 09/18/23  EVAL    PATIENT EDUCATION:  Education details: HEP, POC Person educated: Patient Education method: Medical illustrator Education comprehension: verbalized understanding and returned demonstration  HOME EXERCISE PROGRAM: Access Code: [email or web address] URL: [email or web address] Date: 09/18/2023 Prepared by: Almetta Fam  Exercises - Squat with Chair Touch  - 1 x daily - 7 x weekly - 2 sets - 10 reps - Seated Knee Extension with Resistance  - 1 x daily - 7 x weekly - 2 sets - 10 reps - 3 hold - Seated Knee Lifts with Resistance  - 1 x daily - 7 x weekly - 2 sets - 10 reps - Side Stepping with Resistance at Ankles  - 1 x daily - 7 x weekly - 2 sets - 10 reps - Standing ITB Stretch  - 1 x daily - 7 x weekly - 2 reps - 15 hold  ASSESSMENT:  CLINICAL IMPRESSION: Patient is a 63 y.o. female who was seen today for physical therapy evaluation and treatment for bilateral knee pain and low back pain. She does have fibromyalgia and deals with chronic pain. She had both of her knees replaced less than a year ago. Although she has good range of motion, she does present with weakness in hip flexors and quads. Her pain is more significant on the R side in both the back and knee. MRI shows anterolisthesis at L5-S1 and moderate to severe narrowing which contribute to her ongoing back pain. At eval she is walking with a cane due to having a fall last month, where he R knee gave out on her. Patient has goals to be able to get up and down from the floor, walk over a mile, hike, and travel but her pain is setting her back. She will benefit from skilled PT to address her impairments to be able to attain these goals.   OBJECTIVE IMPAIRMENTS: Abnormal gait, decreased activity tolerance, decreased balance, difficulty walking, decreased ROM, decreased strength, improper body mechanics, and pain.   ACTIVITY LIMITATIONS: carrying, lifting, bending, sitting, standing, squatting, stairs, transfers, and  locomotion level  PARTICIPATION LIMITATIONS: cleaning, laundry, shopping, community activity, and yard work  PERSONAL FACTORS: Past/current experiences, Time since onset of injury/illness/exacerbation, and 1-2 comorbidities: fibromyalgia, HTN, hx of  sx are also affecting patient's functional outcome.   REHAB POTENTIAL: Good  CLINICAL DECISION MAKING: Stable/uncomplicated  EVALUATION COMPLEXITY: Low   GOALS: Goals reviewed with patient? Yes  SHORT TERM GOALS: Target date: 10/30/23  Patient will be independent with initial HEP.  Baseline:  Goal status: INITIAL  2.  Patient will report 50% improvement in low back and bilateral knee pain to improve QOL.  Baseline: 4/10 pain Goal status: INITIAL   LONG TERM GOALS: Target date: 12/11/23  Patient will be independent with advanced/ongoing HEP to improve outcomes and carryover.  Baseline:  Goal status: INITIAL  2.  Patient will be able to complete floor transfer to get in and out of bathtub and return to yoga Baseline: unable to do Goal status: INITIAL  3.  Patient will be able to walk around [place] without rest breaks   Baseline: can walk half of it now Goal status: INITIAL  4.  Patient will demonstrate improved functional strength as demonstrated by 5/5 in weak mm groups. Baseline: 3+ quads, 4 hip flexors Goal status: INITIAL  5.  Patient will report 40/80 or better on LEFS to demonstrate improved functional ability.  Baseline: 20/80 Goal status: INITIAL   6.  Patient will tolerate 30 min of (standing/sitting/walking) to perform hobbies and household chores. Baseline: 10 mins standing tolerance Goal status: INITIAL  PLAN:  PT FREQUENCY: 2x/week  PT DURATION: 12 weeks  PLANNED INTERVENTIONS: 97110-Therapeutic exercises, 97530- Therapeutic activity, 97112- Neuromuscular re-education, 97535- Self Care, [id number]- Manual therapy, [phone]- Gait training, [phone] (1-2 muscles), [phone] (3+ muscles)- Dry Needling, Patient/Family  education, Balance training, Stair training, Taping, Joint mobilization, Joint manipulation, Spinal manipulation, Spinal mobilization, Cryotherapy, and Moist heat  PLAN FOR NEXT SESSION: low back stretching, LE strengthening, functional tasks (stairs, sit to stand), floor transfer  Insurance does not cover traction, e-stim, ionto, vaso  Aquilla Shambley, PT 09/18/2023, 10:54 AM

## 2023-09-18 ENCOUNTER — Ambulatory Visit: Attending: Orthopedic Surgery

## 2023-09-18 DIAGNOSIS — M25562 Pain in left knee: Secondary | ICD-10-CM | POA: Diagnosis present

## 2023-09-18 DIAGNOSIS — M25561 Pain in right knee: Secondary | ICD-10-CM | POA: Insufficient documentation

## 2023-09-18 DIAGNOSIS — M25572 Pain in left ankle and joints of left foot: Secondary | ICD-10-CM | POA: Insufficient documentation

## 2023-09-18 DIAGNOSIS — G8929 Other chronic pain: Secondary | ICD-10-CM | POA: Diagnosis present

## 2023-09-18 DIAGNOSIS — M25511 Pain in right shoulder: Secondary | ICD-10-CM | POA: Diagnosis present

## 2023-09-18 DIAGNOSIS — M6281 Muscle weakness (generalized): Secondary | ICD-10-CM | POA: Diagnosis present

## 2023-09-18 DIAGNOSIS — M5459 Other low back pain: Secondary | ICD-10-CM | POA: Insufficient documentation

## 2023-09-19 NOTE — Therapy (Signed)
 OUTPATIENT PHYSICAL THERAPY LOWER EXTREMITY TREATMENT   Patient Name: Kendra Dodson MRN: [id number] DOB:December 23, 1960, 63 y.o., female Today's Date: 09/20/2023  END OF SESSION:  PT End of Session - 09/20/23 0803     Visit Number 2    Date for PT Re-Evaluation 12/11/23    Authorization Type [employer]    PT Start Time 0803    PT Stop Time 0845    PT Time Calculation (min) 42 min           Past Medical History:  Diagnosis Date   Asthma    Fibromyalgia    Hypothyroid    Migraines    Past Surgical History:  Procedure Laterality Date   ADENOIDECTOMY     CHOLECYSTECTOMY     Patient Active Problem List   Diagnosis Date Noted   Hypothyroidism 10/26/2019    PCP: Christain Asp  REFERRING PROVIDER: Toribio Higashi  REFERRING DIAG: R ITB Syndrome  THERAPY DIAG:  Chronic pain of both knees  Other low back pain  Muscle weakness (generalized)  Rationale for Evaluation and Treatment: Rehabilitation  ONSET DATE: 08/30/23 referral  SUBJECTIVE:   SUBJECTIVE STATEMENT: A little stiff and sore but I will be alright.   EVAL-I had both of my knees done last year. I was walking with the cane and then stopped but now I am started again. This is because my right knee gave out twice. A month ago I fell on my R knee while out in the yard. I have some pain in my back too and they have been giving me shots. Last one was last month.   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 Yes: NPRS scale: 4/10 Pain location: low back and knees  Pain description: chronic, ache, sometimes can be sharp in the knees, pin pricks in the knee Aggravating factors: standing more than 10 mins, doing too much activity  Relieving factors: nothing really  PRECAUTIONS: None  RED FLAGS: None   WEIGHT BEARING RESTRICTIONS: No  FALLS:  Has patient fallen in last 6  months? Yes. 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  DIAGNOSTIC FINDINGS: Grade 1 anterolisthesis of L5 on S1 with moderate spinal canal narrowing, severe narrowing of the bilateral lateral recesses, and moderate bilateral neural foraminal narrowing.    PATIENT SURVEYS:  LEFS 20/80  COGNITION: Overall cognitive status: Within functional limits for tasks assessed     SENSATION: WFL Light touch: left lateral foot is numb all the time   PALPATION: Some TTP right low back  LUMBAR ROM: Flexion- WFL some pain going down and coming up on R side Extension- 75% with pain  Right lateral flexion- WNL Left lateral flexion- WNL Right rotation- WNL Left rotation- WNL  LOWER EXTREMITY ROM: WFL bilateral    LOWER EXTREMITY MMT:  MMT Right eval Left eval  Hip flexion 4- w/pain 4-  Hip extension  Hip abduction    Hip adduction    Hip internal rotation    Hip external rotation    Knee flexion 4 4  Knee extension 3+ 3+  Ankle dorsiflexion    Ankle plantarflexion    Ankle inversion    Ankle eversion     (Blank rows = not tested)   FUNCTIONAL TESTS:  5 times sit to stand: 15s from chair but with difficulty  Timed up and go (TUG): 11.65s  GAIT: Distance walked: in clinic distances Assistive device utilized: Single point cane Level of assistance: Complete Independence                                                                                                                                TREATMENT DATE:  09/20/23 Bike L2 x75mins  Supine LE stretches- HS, glutes, piriformis, ITB Bridges 2x10 STS 2x10 LAQ 3# 2x10 HS curls green 2x10 Step ups 6  09/18/23 EVAL    PATIENT EDUCATION:  Education details: HEP, POC Person educated: Patient Education method: Medical illustrator Education comprehension: verbalized understanding and returned demonstration  HOME EXERCISE PROGRAM: Access Code: [email or web address] URL: [email or web address] Date: 09/18/2023 Prepared by: Almetta Fam  Exercises - Squat with Chair Touch  - 1 x daily - 7 x weekly - 2 sets - 10 reps - Seated Knee Extension with Resistance  - 1 x daily - 7 x weekly - 2 sets - 10 reps - 3 hold - Seated Knee Lifts with Resistance  - 1 x daily - 7 x weekly - 2 sets - 10 reps - Side Stepping with Resistance at Ankles  - 1 x daily - 7 x weekly - 2 sets - 10 reps - Standing ITB Stretch  - 1 x daily - 7 x weekly - 2 reps - 15 hold  ASSESSMENT:  CLINICAL IMPRESSION: Patient is a 62 y.o. female who was seen today for physical therapy treatment for bilateral knee pain and low back pain. She does have fibromyalgia and deals with chronic pain. Reports being compliant with HEP and reports they are going well. Today we started with light strengthening and some functional movements including sit to stands and step ups. Some fatigue noted with step ups. Does well throughout session and will continue to progress each time.    FROM EVAL--She had both of her knees replaced less than a year ago. Although she has good range of motion, she does present with weakness in hip flexors and quads. Her pain is more significant on the R side in both the back and knee. MRI shows anterolisthesis at L5-S1 and moderate to severe narrowing which contribute to her ongoing back pain. At eval she is walking with a cane due to having a fall last month, where he R knee gave out on her. Patient has goals to be able to get up and  down from the floor, walk over a mile, hike, and travel but her pain  is setting her back. She will benefit from skilled PT to address her impairments to be able to attain these goals.    OBJECTIVE IMPAIRMENTS: Abnormal gait, decreased activity tolerance, decreased balance, difficulty walking, decreased ROM, decreased strength, improper body mechanics, and pain.   ACTIVITY LIMITATIONS: carrying, lifting, bending, sitting, standing, squatting, stairs, transfers, and locomotion level  PARTICIPATION LIMITATIONS: cleaning, laundry, shopping, community activity, and yard work  PERSONAL FACTORS: Past/current experiences, Time since onset of injury/illness/exacerbation, and 1-2 comorbidities: fibromyalgia, HTN, hx of sx are also affecting patient's functional outcome.   REHAB POTENTIAL: Good  CLINICAL DECISION MAKING: Stable/uncomplicated  EVALUATION COMPLEXITY: Low   GOALS: Goals reviewed with patient? Yes  SHORT TERM GOALS: Target date: 10/30/23  Patient will be independent with initial HEP.  Baseline:  Goal status: INITIAL  2.  Patient will report 50% improvement in low back and bilateral knee pain to improve QOL.  Baseline: 4/10 pain Goal status: INITIAL   LONG TERM GOALS: Target date: 12/11/23  Patient will be independent with advanced/ongoing HEP to improve outcomes and carryover.  Baseline:  Goal status: INITIAL  2.  Patient will be able to complete floor transfer to get in and out of bathtub and return to yoga Baseline: unable to do Goal status: INITIAL  3.  Patient will be able to walk around [place] without rest breaks   Baseline: can walk half of it now Goal status: INITIAL  4.  Patient will demonstrate improved functional strength as demonstrated by 5/5 in weak mm groups. Baseline: 3+ quads, 4 hip flexors Goal status: INITIAL  5.  Patient will report 40/80 or better on LEFS to demonstrate improved functional ability.  Baseline: 20/80 Goal status: INITIAL   6.   Patient will tolerate 30 min of (standing/sitting/walking) to perform hobbies and household chores. Baseline: 10 mins standing tolerance Goal status: INITIAL  PLAN:  PT FREQUENCY: 2x/week  PT DURATION: 12 weeks  PLANNED INTERVENTIONS: 97110-Therapeutic exercises, 97530- Therapeutic activity, 97112- Neuromuscular re-education, 97535- Self Care, [id number]- Manual therapy, [phone]- Gait training, [phone] (1-2 muscles), [phone] (3+ muscles)- Dry Needling, Patient/Family education, Balance training, Stair training, Taping, Joint mobilization, Joint manipulation, Spinal manipulation, Spinal mobilization, Cryotherapy, and Moist heat  PLAN FOR NEXT SESSION: low back stretching, LE strengthening, functional tasks (stairs, sit to stand), floor transfer  Insurance does not cover traction, e-stim, ionto, vaso  Almetta Fam, PT 09/20/2023, 8:46 AM

## 2023-09-20 ENCOUNTER — Ambulatory Visit

## 2023-09-20 DIAGNOSIS — M25561 Pain in right knee: Secondary | ICD-10-CM | POA: Diagnosis not present

## 2023-09-20 DIAGNOSIS — G8929 Other chronic pain: Secondary | ICD-10-CM

## 2023-09-20 DIAGNOSIS — M5459 Other low back pain: Secondary | ICD-10-CM

## 2023-09-20 DIAGNOSIS — M6281 Muscle weakness (generalized): Secondary | ICD-10-CM

## 2023-09-23 ENCOUNTER — Ambulatory Visit

## 2023-09-23 ENCOUNTER — Other Ambulatory Visit: Payer: Self-pay

## 2023-09-23 DIAGNOSIS — G8929 Other chronic pain: Secondary | ICD-10-CM

## 2023-09-23 DIAGNOSIS — M25561 Pain in right knee: Secondary | ICD-10-CM | POA: Diagnosis not present

## 2023-09-23 DIAGNOSIS — M6281 Muscle weakness (generalized): Secondary | ICD-10-CM

## 2023-09-23 DIAGNOSIS — M5459 Other low back pain: Secondary | ICD-10-CM

## 2023-09-23 NOTE — Therapy (Signed)
 OUTPATIENT PHYSICAL THERAPY LOWER EXTREMITY TREATMENT   Patient Name: Kendra Dodson MRN: [id number] DOB:[birth year], 63 y.o., female Today's Date: 09/23/2023  END OF SESSION:  PT End of Session - 09/23/23 1607     Visit Number 3    Date for PT Re-Evaluation 12/11/23    Authorization Type [employer]    PT Start Time 1603    PT Stop Time 1645    PT Time Calculation (min) 42 min    Activity Tolerance Patient tolerated treatment well    Behavior During Therapy WFL for tasks assessed/performed            Past Medical History:  Diagnosis Date   Asthma    Fibromyalgia    Hypothyroid    Migraines    Past Surgical History:  Procedure Laterality Date   ADENOIDECTOMY     CHOLECYSTECTOMY     Patient Active Problem List   Diagnosis Date Noted   Hypothyroidism 10/26/2019    PCP: Christain Asp  REFERRING PROVIDER: Toribio Higashi  REFERRING DIAG: R ITB Syndrome  THERAPY DIAG:  Chronic pain of both knees  Other low back pain  Muscle weakness (generalized)  Rationale for Evaluation and Treatment: Rehabilitation  ONSET DATE: 08/30/23 referral  SUBJECTIVE:   SUBJECTIVE STATEMENT: Feeling ok, no new complaints, not terribly sore today  EVAL-I had both of my knees done last year. I was walking with the cane and then stopped but now I am started again. This is because my right knee gave out twice. A month ago I fell on my R knee while out in the yard. I have some pain in my back too and they have been giving me shots. Last one was last month.   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 Yes: NPRS scale: 4/10 Pain location: low back and knees  Pain description: chronic, ache, sometimes can be sharp in the knees, pin pricks in the knee Aggravating factors: standing more than 10 mins, doing too much activity  Relieving factors: nothing  really  PRECAUTIONS: None  RED FLAGS: None   WEIGHT BEARING RESTRICTIONS: No  FALLS:  Has patient fallen in last 6 months? Yes. 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  DIAGNOSTIC FINDINGS: Grade 1 anterolisthesis of L5 on S1 with moderate spinal canal narrowing, severe narrowing of the bilateral lateral recesses, and moderate bilateral neural foraminal narrowing.    PATIENT SURVEYS:  LEFS 20/80  COGNITION: Overall cognitive status: Within functional limits for tasks assessed     SENSATION: WFL Light touch: left lateral foot is numb all the time   PALPATION: Some TTP right low back  LUMBAR ROM: Flexion- WFL some pain going down and coming up on R side Extension- 75% with pain  Right lateral flexion- WNL Left lateral flexion- WNL Right rotation- WNL Left rotation- WNL  LOWER EXTREMITY ROM: WFL bilateral    LOWER  EXTREMITY MMT:  MMT Right eval Left eval  Hip flexion 4- w/pain 4-  Hip extension    Hip abduction    Hip adduction    Hip internal rotation    Hip external rotation    Knee flexion 4 4  Knee extension 3+ 3+  Ankle dorsiflexion    Ankle plantarflexion    Ankle inversion    Ankle eversion     (Blank rows = not tested)   FUNCTIONAL TESTS:  5 times sit to stand: 15s from chair but with difficulty  Timed up and go (TUG): 11.65s  GAIT: Distance walked: in clinic distances Assistive device utilized: Single point cane Level of assistance: Complete  Independence                                                                                                                               TREATMENT DATE: 09/23/23:  Bike, L 2.1, 6 min  In ll bars for stability, balance: semitandem standing 30 sec static positioning with lead foot on 2 step,  Also with 15 reps horizontal head turns Towel slides in ll bars for, back, side/side, cw, ccw mass practice each foot Supine with feet over 65 cm physioball, B heel to chest, alt leg lift lifts, bridging Supine therapist assisted B knee to chest stretch and lumbar rotation stretch  Knee ext 10#, 3 x 10    09/20/23 Bike L2 x65mins  Supine LE stretches- HS, glutes, piriformis, ITB Bridges 2x10 STS 2x10 LAQ 3# 2x10 HS curls green 2x10 Step ups 6  09/18/23 EVAL    PATIENT EDUCATION:  Education details: HEP, POC Person educated: Patient Education method: Medical illustrator Education comprehension: verbalized understanding and returned demonstration  HOME EXERCISE PROGRAM: Access Code: [email or web address] URL: [email or web address] Date: 09/18/2023 Prepared by: Almetta Fam  Exercises - Squat with Chair Touch  - 1 x daily - 7 x weekly - 2 sets - 10 reps - Seated Knee Extension with Resistance  - 1 x daily - 7 x weekly - 2 sets - 10 reps - 3 hold - Seated Knee Lifts with Resistance  - 1 x daily - 7 x weekly - 2 sets - 10 reps - Side Stepping with Resistance at Ankles  - 1 x daily - 7 x weekly - 2 sets - 10 reps - Standing ITB Stretch  - 1 x daily - 7 x weekly - 2 reps - 15 hold  ASSESSMENT:  CLINICAL IMPRESSION: Patient is a 63 y.o. female who participated in her 3rd physical treatment treatment today . We continued to progress with abdominal recruitment and stability and Le strength, added some balance, proprioceptive sense today as well. She tolerated well, pain throughout in R hip and knee even with adaptations to routine.    FROM EVAL--She had both of her knees  replaced less than a year ago. Although she has good range of motion, she does present with weakness in hip flexors and  quads. Her pain is more significant on the R side in both the back and knee. MRI shows anterolisthesis at L5-S1 and moderate to severe narrowing which contribute to her ongoing back pain. At eval she is walking with a cane due to having a fall last month, where he R knee gave out on her. Patient has goals to be able to get up and down from the floor, walk over a mile, hike, and travel but her pain is setting her back. She will benefit from skilled PT to address her impairments to be able to attain these goals.    OBJECTIVE IMPAIRMENTS: Abnormal gait, decreased activity tolerance, decreased balance, difficulty walking, decreased ROM, decreased strength, improper body mechanics, and pain.   ACTIVITY LIMITATIONS: carrying, lifting, bending, sitting, standing, squatting, stairs, transfers, and locomotion level  PARTICIPATION LIMITATIONS: cleaning, laundry, shopping, community activity, and yard work  PERSONAL FACTORS: Past/current experiences, Time since onset of injury/illness/exacerbation, and 1-2 comorbidities: fibromyalgia, HTN, hx of sx are also affecting patient's functional outcome.   REHAB POTENTIAL: Good  CLINICAL DECISION MAKING: Stable/uncomplicated  EVALUATION COMPLEXITY: Low   GOALS: Goals reviewed with patient? Yes  SHORT TERM GOALS: Target date: 10/30/23  Patient will be independent with initial HEP.  Baseline:  Goal status: INITIAL  2.  Patient will report 50% improvement in low back and bilateral knee pain to improve QOL.  Baseline: 4/10 pain Goal status: INITIAL   LONG TERM GOALS: Target date: 12/11/23  Patient will be independent with advanced/ongoing HEP to improve outcomes and carryover.  Baseline:  Goal status: INITIAL  2.  Patient will be able to complete floor transfer to get in and out of bathtub and return to yoga Baseline: unable to  do Goal status: INITIAL  3.  Patient will be able to walk around [place] without rest breaks   Baseline: can walk half of it now Goal status: INITIAL  4.  Patient will demonstrate improved functional strength as demonstrated by 5/5 in weak mm groups. Baseline: 3+ quads, 4 hip flexors Goal status: INITIAL  5.  Patient will report 40/80 or better on LEFS to demonstrate improved functional ability.  Baseline: 20/80 Goal status: INITIAL   6.  Patient will tolerate 30 min of (standing/sitting/walking) to perform hobbies and household chores. Baseline: 10 mins standing tolerance Goal status: INITIAL  PLAN:  PT FREQUENCY: 2x/week  PT DURATION: 12 weeks  PLANNED INTERVENTIONS: 97110-Therapeutic exercises, 97530- Therapeutic activity, 97112- Neuromuscular re-education, 97535- Self Care, [id number]- Manual therapy, [phone]- Gait training, [phone] (1-2 muscles), [phone] (3+ muscles)- Dry Needling, Patient/Family education, Balance training, Stair training, Taping, Joint mobilization, Joint manipulation, Spinal manipulation, Spinal mobilization, Cryotherapy, and Moist heat  PLAN FOR NEXT SESSION: low back stretching, LE strengthening, functional tasks (stairs, sit to stand), floor transfer  Insurance does not cover traction, e-stim, ionto, vaso  Lutie Pickler L Kallon Caylor, PT, DPT, OCS 09/23/2023, 4:46 PM

## 2023-09-25 ENCOUNTER — Ambulatory Visit: Admitting: Physical Therapy

## 2023-09-25 ENCOUNTER — Encounter: Payer: Self-pay | Admitting: Physical Therapy

## 2023-09-25 DIAGNOSIS — M25561 Pain in right knee: Secondary | ICD-10-CM | POA: Diagnosis not present

## 2023-09-25 DIAGNOSIS — G8929 Other chronic pain: Secondary | ICD-10-CM

## 2023-09-25 DIAGNOSIS — M5459 Other low back pain: Secondary | ICD-10-CM

## 2023-09-25 DIAGNOSIS — M6281 Muscle weakness (generalized): Secondary | ICD-10-CM

## 2023-09-25 NOTE — Therapy (Signed)
 OUTPATIENT PHYSICAL THERAPY LOWER EXTREMITY TREATMENT   Patient Name: Kendra Dodson MRN: [id number] DOB:1961/02/21, 63 y.o., female Today's Date: 09/25/2023  END OF SESSION:  PT End of Session - 09/25/23 1656     Visit Number 4    Date for PT Re-Evaluation 12/11/23    Authorization Type [employer]    PT Start Time 1656    PT Stop Time 1742    PT Time Calculation (min) 46 min    Activity Tolerance Patient tolerated treatment well    Behavior During Therapy WFL for tasks assessed/performed            Past Medical History:  Diagnosis Date   Asthma    Fibromyalgia    Hypothyroid    Migraines    Past Surgical History:  Procedure Laterality Date   ADENOIDECTOMY     CHOLECYSTECTOMY     Patient Active Problem List   Diagnosis Date Noted   Hypothyroidism 10/26/2019    PCP: Christain Asp  REFERRING PROVIDER: Toribio Higashi  REFERRING DIAG: R ITB Syndrome  THERAPY DIAG:  Chronic pain of both knees  Other low back pain  Muscle weakness (generalized)  Rationale for Evaluation and Treatment: Rehabilitation  ONSET DATE: 08/30/23 referral  SUBJECTIVE:   SUBJECTIVE STATEMENT: Okay, something we did last time set off my pain  EVAL-I had both of my knees done last year. I was walking with the cane and then stopped but now I am started again. This is because my right knee gave out twice. A month ago I fell on my R knee while out in the yard. I have some pain in my back too and they have been giving me shots. Last one was last month.   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 Yes: NPRS scale: 4/10 Pain location: low back and knees  Pain description: chronic, ache, sometimes can be sharp in the knees, pin pricks in the knee Aggravating factors: standing more than 10 mins, doing too much activity  Relieving factors: nothing  really  PRECAUTIONS: None  RED FLAGS: None   WEIGHT BEARING RESTRICTIONS: No  FALLS:  Has patient fallen in last 6 months? Yes. 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  DIAGNOSTIC FINDINGS: Grade 1 anterolisthesis of L5 on S1 with moderate spinal canal narrowing, severe narrowing of the bilateral lateral recesses, and moderate bilateral neural foraminal narrowing.    PATIENT SURVEYS:  LEFS 20/80  COGNITION: Overall cognitive status: Within functional limits for tasks assessed     SENSATION: WFL Light touch: left lateral foot is numb all the time   PALPATION: Some TTP right low back  LUMBAR ROM: Flexion- WFL some pain going down and coming up on R side Extension- 75% with pain  Right lateral flexion- WNL Left lateral flexion- WNL Right rotation- WNL Left rotation- WNL  LOWER EXTREMITY ROM: WFL bilateral  LOWER EXTREMITY MMT:  MMT Right eval Left eval  Hip flexion 4- w/pain 4-  Hip extension    Hip abduction    Hip adduction    Hip internal rotation    Hip external rotation    Knee flexion 4 4  Knee extension 3+ 3+  Ankle dorsiflexion    Ankle plantarflexion    Ankle inversion    Ankle eversion     (Blank rows = not tested)   FUNCTIONAL TESTS:  5 times sit to stand: 15s from chair but with difficulty  Timed up and go (TUG): 11.65s  GAIT: Distance walked: in clinic distances Assistive device utilized: Single point cane Level of assistance: Complete  Independence                                                                                                                               TREATMENT DATE: 09/25/23 Bike level 4 x 5 minutes Feet on ball K2C, rotation, bridge and isometric abs Ball b/n knees squeeze with bridge Passive stretch of the right LE STM with hands and with t-gun to the right ITB very tender distal ITB  09/23/23:  Bike, L 2.1, 6 min  In ll bars for stability, balance: semitandem standing 30 sec static positioning with lead foot on 2 step,  Also with 15 reps horizontal head turns Towel slides in ll bars for, back, side/side, cw, ccw mass practice each foot Supine with feet over 65 cm physioball, B heel to chest, alt leg lift lifts, bridging Supine therapist assisted B knee to chest stretch and lumbar rotation stretch  Knee ext 10#, 3 x 10    09/20/23 Bike L2 x41mins  Supine LE stretches- HS, glutes, piriformis, ITB Bridges 2x10 STS 2x10 LAQ 3# 2x10 HS curls green 2x10 Step ups 6  09/18/23 EVAL    PATIENT EDUCATION:  Education details: HEP, POC Person educated: Patient Education method: Medical illustrator Education comprehension: verbalized understanding and returned demonstration  HOME EXERCISE PROGRAM: Access Code: [email or web address] URL: [email or web address] Date: 09/18/2023 Prepared by: Almetta Fam  Exercises - Squat with Chair Touch  - 1 x daily - 7 x weekly - 2 sets - 10 reps - Seated Knee Extension with Resistance  - 1 x daily - 7 x weekly - 2 sets - 10 reps - 3 hold - Seated Knee Lifts with Resistance  - 1 x daily - 7 x weekly - 2 sets - 10 reps - Side Stepping with Resistance at Ankles  - 1 x daily - 7 x weekly - 2 sets - 10 reps - Standing ITB Stretch  - 1 x daily - 7 x weekly - 2 reps - 15 hold  ASSESSMENT:  CLINICAL IMPRESSION: Patient is a 63 y.o. female who participated in her 4th physical treatment treatment today . She reports that the last treatment flared her  up and she is having a little more pain in the right lateral hip and  thigh.  I continued with some stability and then flexibility, she is very flexible but did get a good stretch with hip flexor and ITB, HS and piriformis were very flexible.  I focused today on STM to the right ITB, she does have knots and is very tender.   FROM EVAL--She had both of her knees replaced less than a year ago. Although she has good range of motion, she does present with weakness in hip flexors and quads. Her pain is more significant on the R side in both the back and knee. MRI shows anterolisthesis at L5-S1 and moderate to severe narrowing which contribute to her ongoing back pain. At eval she is walking with a cane due to having a fall last month, where he R knee gave out on her. Patient has goals to be able to get up and down from the floor, walk over a mile, hike, and travel but her pain is setting her back. She will benefit from skilled PT to address her impairments to be able to attain these goals.    OBJECTIVE IMPAIRMENTS: Abnormal gait, decreased activity tolerance, decreased balance, difficulty walking, decreased ROM, decreased strength, improper body mechanics, and pain.   ACTIVITY LIMITATIONS: carrying, lifting, bending, sitting, standing, squatting, stairs, transfers, and locomotion level  PARTICIPATION LIMITATIONS: cleaning, laundry, shopping, community activity, and yard work  PERSONAL FACTORS: Past/current experiences, Time since onset of injury/illness/exacerbation, and 1-2 comorbidities: fibromyalgia, HTN, hx of sx are also affecting patient's functional outcome.   REHAB POTENTIAL: Good  CLINICAL DECISION MAKING: Stable/uncomplicated  EVALUATION COMPLEXITY: Low   GOALS: Goals reviewed with patient? Yes  SHORT TERM GOALS: Target date: 10/30/23  Patient will be independent with initial HEP.  Baseline:  Goal status: met 09/25/23  2.  Patient will report 50% improvement in low back and bilateral  knee pain to improve QOL.  Baseline: 4/10 pain Goal status: ongoing 09/25/23   LONG TERM GOALS: Target date: 12/11/23  Patient will be independent with advanced/ongoing HEP to improve outcomes and carryover.  Baseline:  Goal status: INITIAL  2.  Patient will be able to complete floor transfer to get in and out of bathtub and return to yoga Baseline: unable to do Goal status: INITIAL  3.  Patient will be able to walk around [place] without rest breaks   Baseline: can walk half of it now Goal status: INITIAL  4.  Patient will demonstrate improved functional strength as demonstrated by 5/5 in weak mm groups. Baseline: 3+ quads, 4 hip flexors Goal status: INITIAL  5.  Patient will report 40/80 or better on LEFS to demonstrate improved functional ability.  Baseline: 20/80 Goal status: INITIAL   6.  Patient will tolerate 30 min of (standing/sitting/walking) to perform hobbies and household chores. Baseline: 10 mins standing tolerance Goal status: INITIAL  PLAN:  PT FREQUENCY: 2x/week  PT DURATION: 12 weeks  PLANNED INTERVENTIONS: 97110-Therapeutic exercises, 97530- Therapeutic activity, 97112- Neuromuscular re-education, 97535- Self Care, 02859- Manual therapy, [phone]- Gait training, [phone] (1-2 muscles), [phone] (3+ muscles)- Dry Needling, Patient/Family education, Balance training, Stair training, Taping, Joint mobilization, Joint manipulation, Spinal manipulation, Spinal mobilization, Cryotherapy, and Moist heat  PLAN FOR NEXT SESSION: low back stretching, LE strengthening, functional tasks (stairs, sit to stand), floor transfer, see how the STM went  Insurance does not cover traction, e-stim, ionto, vaso  Allah Reason W, PT,  09/25/2023, 4:57 PM

## 2023-10-01 ENCOUNTER — Ambulatory Visit: Admitting: Physical Therapy

## 2023-10-01 ENCOUNTER — Encounter: Payer: Self-pay | Admitting: Physical Therapy

## 2023-10-01 DIAGNOSIS — G8929 Other chronic pain: Secondary | ICD-10-CM

## 2023-10-01 DIAGNOSIS — M25561 Pain in right knee: Secondary | ICD-10-CM | POA: Diagnosis not present

## 2023-10-01 DIAGNOSIS — M6281 Muscle weakness (generalized): Secondary | ICD-10-CM

## 2023-10-01 DIAGNOSIS — M25511 Pain in right shoulder: Secondary | ICD-10-CM

## 2023-10-01 DIAGNOSIS — M25572 Pain in left ankle and joints of left foot: Secondary | ICD-10-CM

## 2023-10-01 DIAGNOSIS — M5459 Other low back pain: Secondary | ICD-10-CM

## 2023-10-01 NOTE — Therapy (Signed)
 OUTPATIENT PHYSICAL THERAPY LOWER EXTREMITY TREATMENT   Patient Name: Necie Wilcoxson MRN: [id number] DOB:03-23-1960, 63 y.o., female Today's Date: 10/01/2023  END OF SESSION:  PT End of Session - 10/01/23 1301     Visit Number 5    Date for PT Re-Evaluation 12/11/23    Authorization Type [employer]    PT Start Time 1303    PT Stop Time 1342    PT Time Calculation (min) 39 min    Activity Tolerance Patient tolerated treatment well    Behavior During Therapy WFL for tasks assessed/performed             Past Medical History:  Diagnosis Date   Asthma    Fibromyalgia    Hypothyroid    Migraines    Past Surgical History:  Procedure Laterality Date   ADENOIDECTOMY     CHOLECYSTECTOMY     Patient Active Problem List   Diagnosis Date Noted   Hypothyroidism 10/26/2019    PCP: Christain Asp  REFERRING PROVIDER: Toribio Higashi  REFERRING DIAG: R ITB Syndrome  THERAPY DIAG:  Chronic pain of both knees  Other low back pain  Muscle weakness (generalized)  Right shoulder pain, unspecified chronicity  Pain in left ankle and joints of left foot  Rationale for Evaluation and Treatment: Rehabilitation  ONSET DATE: 08/30/23 referral  SUBJECTIVE:   SUBJECTIVE STATEMENT:  Manual made side very sore and I couldn't sleep on that side for a few nights    EVAL-I had both of my knees done last year. I was walking with the cane and then stopped but now I am started again. This is because my right knee gave out twice. A month ago I fell on my R knee while out in the yard. I have some pain in my back too and they have been giving me shots. Last one was last month.   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 Yes: NPRS scale: 4.5/10 Pain location: low back and knees  Pain description: chronic, ache, sometimes can be sharp in the knees,  pin pricks in the knee Aggravating factors: standing more than 10 mins, doing too much activity  Relieving factors: nothing really  PRECAUTIONS: None  RED FLAGS: None   WEIGHT BEARING RESTRICTIONS: No  FALLS:  Has patient fallen in last 6 months? Yes. 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  DIAGNOSTIC FINDINGS: Grade 1 anterolisthesis of L5 on S1 with moderate spinal canal narrowing, severe narrowing of the bilateral lateral recesses, and moderate bilateral neural foraminal narrowing.    PATIENT SURVEYS:  LEFS 20/80  COGNITION: Overall cognitive status: Within functional limits for tasks assessed     SENSATION: WFL Light touch: left lateral foot is numb all the time   PALPATION: Some TTP right low back  LUMBAR ROM: Flexion- WFL some pain going down and coming up on R side Extension- 75%  with pain  Right lateral flexion- WNL Left lateral flexion- WNL Right rotation- WNL Left rotation- WNL  LOWER EXTREMITY ROM: WFL bilateral    LOWER EXTREMITY MMT:  MMT Right eval Left eval  Hip flexion 4- w/pain 4-  Hip extension    Hip abduction    Hip adduction    Hip internal rotation    Hip external rotation    Knee flexion 4 4  Knee extension 3+ 3+  Ankle dorsiflexion    Ankle plantarflexion    Ankle inversion    Ankle eversion     (Blank rows = not tested)   FUNCTIONAL TESTS:  5 times sit to stand: 15s from chair but with difficulty  Timed up and go (TUG):  11.65s  GAIT: Distance walked: in clinic distances Assistive device utilized: Single point cane Level of assistance: Complete Independence                                                                                                                               TREATMENT DATE:  10/01/23  Scifit bike L3 x8 minutes seat 10  Bridge + ABD into red TB 2x12 Sidelying clams red TB 2x12 B  Hooklying TA sets 20x3 seconds Hooklying TA set + march x15 B Hooklying TA set  + opposite alternating UE/LE flexion x20 B Modified quadruped at mat table: TA set + alternating UE raises x20 (overall limited by knee pain in this position) Hip hikes x15 B limited by pain QL stretch forward/right/left 1x30 seconds each     09/25/23 Bike level 4 x 5 minutes Feet on ball K2C, rotation, bridge and isometric abs Ball b/n knees squeeze with bridge Passive stretch of the right LE STM with hands and with t-gun to the right ITB very tender distal ITB  09/23/23:  Bike, L 2.1, 6 min  In ll bars for stability, balance: semitandem standing 30 sec static positioning with lead foot on 2 step,  Also with 15 reps horizontal head turns Towel slides in ll bars for, back, side/side, cw, ccw mass practice each foot Supine with feet over 65 cm physioball, B heel to chest, alt leg lift lifts, bridging Supine therapist assisted B knee to chest stretch and lumbar rotation stretch  Knee ext 10#, 3 x 10    09/20/23 Bike L2 x85mins  Supine LE stretches- HS, glutes, piriformis, ITB Bridges 2x10 STS 2x10 LAQ 3# 2x10 HS curls green 2x10 Step ups 6  09/18/23 EVAL    PATIENT EDUCATION:  Education details: HEP, POC Person educated: Patient Education method: Medical illustrator Education comprehension: verbalized understanding and returned demonstration  HOME EXERCISE PROGRAM: Access Code: [email or web address] URL: [email or web address] Date: 09/18/2023 Prepared by: Almetta Fam  Exercises -  Squat with Chair Touch  - 1 x daily - 7 x weekly - 2 sets - 10 reps - Seated Knee Extension with Resistance  - 1 x daily - 7 x weekly - 2 sets -  10 reps - 3 hold - Seated Knee Lifts with Resistance  - 1 x daily - 7 x weekly - 2 sets - 10 reps - Side Stepping with Resistance at Ankles  - 1 x daily - 7 x weekly - 2 sets - 10 reps - Standing ITB Stretch  - 1 x daily - 7 x weekly - 2 reps - 15 hold  ASSESSMENT:  CLINICAL IMPRESSION:  Arrives today doing OK, STM caused a lot of extra pain actually. Worked more on proximal strengthening today- flexibility is actually looking good. May benefit from skilled water  PT, not sure if her insurance will cover this however.      FROM EVAL--She had both of her knees replaced less than a year ago. Although she has good range of motion, she does present with weakness in hip flexors and quads. Her pain is more significant on the R side in both the back and knee. MRI shows anterolisthesis at L5-S1 and moderate to severe narrowing which contribute to her ongoing back pain. At eval she is walking with a cane due to having a fall last month, where he R knee gave out on her. Patient has goals to be able to get up and down from the floor, walk over a mile, hike, and travel but her pain is setting her back. She will benefit from skilled PT to address her impairments to be able to attain these goals.    OBJECTIVE IMPAIRMENTS: Abnormal gait, decreased activity tolerance, decreased balance, difficulty walking, decreased ROM, decreased strength, improper body mechanics, and pain.   ACTIVITY LIMITATIONS: carrying, lifting, bending, sitting, standing, squatting, stairs, transfers, and locomotion level  PARTICIPATION LIMITATIONS: cleaning, laundry, shopping, community activity, and yard work  PERSONAL FACTORS: Past/current experiences, Time since onset of injury/illness/exacerbation, and 1-2 comorbidities: fibromyalgia, HTN, hx of sx are also affecting patient's functional  outcome.   REHAB POTENTIAL: Good  CLINICAL DECISION MAKING: Stable/uncomplicated  EVALUATION COMPLEXITY: Low   GOALS: Goals reviewed with patient? Yes  SHORT TERM GOALS: Target date: 10/30/23  Patient will be independent with initial HEP.  Baseline:  Goal status: met 09/25/23  2.  Patient will report 50% improvement in low back and bilateral knee pain to improve QOL.  Baseline: 4/10 pain Goal status: ongoing 09/25/23   LONG TERM GOALS: Target date: 12/11/23  Patient will be independent with advanced/ongoing HEP to improve outcomes and carryover.  Baseline:  Goal status: INITIAL  2.  Patient will be able to complete floor transfer to get in and out of bathtub and return to yoga Baseline: unable to do Goal status: INITIAL  3.  Patient will be able to walk around [place] without rest breaks   Baseline: can walk half of it now Goal status: INITIAL  4.  Patient will demonstrate improved functional strength as demonstrated by 5/5 in weak mm groups. Baseline: 3+ quads, 4 hip flexors Goal status: INITIAL  5.  Patient will report 40/80 or better on LEFS to demonstrate improved functional ability.  Baseline: 20/80 Goal status: INITIAL   6.  Patient will tolerate 30 min of (standing/sitting/walking) to perform hobbies and household chores. Baseline: 10 mins standing tolerance Goal status: INITIAL  PLAN:  PT FREQUENCY: 2x/week  PT DURATION: 12 weeks  PLANNED INTERVENTIONS: 97110-Therapeutic exercises, 97530- Therapeutic activity, 97112- Neuromuscular re-education, 97535- Self Care, [id number]- Manual therapy, [phone]- Gait training, [phone] (1-2 muscles), [phone] (3+ muscles)- Dry Needling, Patient/Family education, Balance training, Stair training, Taping, Joint mobilization, Joint manipulation, Spinal manipulation, Spinal mobilization, Cryotherapy,  and Moist heat  PLAN FOR NEXT SESSION: low back stretching, LE strengthening, functional tasks (stairs, sit to stand), floor transfer,  see how the STM went. Find out if her insurance will cover water  PT/add to cert and schedule it in if so   Insurance does not cover traction, e-stim, ionto, vaso  Abubakar Crispo, PT, DPT 10/01/23 1:43 PM

## 2023-10-03 ENCOUNTER — Ambulatory Visit: Admitting: Physical Therapy

## 2023-10-07 ENCOUNTER — Ambulatory Visit: Admitting: Physical Therapy

## 2023-10-08 NOTE — Therapy (Signed)
 OUTPATIENT PHYSICAL THERAPY LOWER EXTREMITY TREATMENT   Patient Name: Kendra Dodson MRN: [id number] DOB:[birth year], 63 y.o., female Today's Date: 10/09/2023  END OF SESSION:  PT End of Session - 10/09/23 0846     Visit Number 6    Date for PT Re-Evaluation 12/11/23    Authorization Type [employer]    PT Start Time 0845    PT Stop Time 0930    PT Time Calculation (min) 45 min    Activity Tolerance Patient tolerated treatment well    Behavior During Therapy [facility] for tasks assessed/performed              Past Medical History:  Diagnosis Date   Asthma    Fibromyalgia    Hypothyroid    Migraines    Past Surgical History:  Procedure Laterality Date   ADENOIDECTOMY     CHOLECYSTECTOMY     Patient Active Problem List   Diagnosis Date Noted   Hypothyroidism 10/26/2019    PCP: Christain Asp  REFERRING PROVIDER: Toribio Higashi  REFERRING DIAG: R ITB Syndrome  THERAPY DIAG:  Chronic pain of both knees  Other low back pain  Muscle weakness (generalized)  Rationale for Evaluation and Treatment: Rehabilitation  ONSET DATE: 08/30/23 referral  SUBJECTIVE:   SUBJECTIVE STATEMENT:  The deep tissue massage set off a number of problems. Right now there is more pain in the R knee. But I have been doing my exercises.    EVAL-I had both of my knees done last year. I was walking with the cane and then stopped but now I am started again. This is because my right knee gave out twice. A month ago I fell on my R knee while out in the yard. I have some pain in my back too and they have been giving me shots. Last one was last month.   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 Yes: NPRS scale: 4.5/10 Pain location: low back and knees  Pain description: chronic, ache, sometimes can be sharp in the knees, pin pricks in the knee Aggravating  factors: standing more than 10 mins, doing too much activity  Relieving factors: nothing really  PRECAUTIONS: None  RED FLAGS: None   WEIGHT BEARING RESTRICTIONS: No  FALLS:  Has patient fallen in last 6 months? Yes. 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  DIAGNOSTIC FINDINGS: Grade 1 anterolisthesis of L5 on S1 with moderate spinal canal narrowing, severe narrowing of the bilateral lateral recesses, and moderate bilateral neural foraminal narrowing.    PATIENT SURVEYS:  LEFS 20/80  COGNITION: Overall cognitive status: Within functional limits for tasks assessed     SENSATION: WFL Light touch: left lateral foot is numb all the time   PALPATION: Some TTP right low back  LUMBAR ROM: Flexion- WFL some pain going down and coming up on R side Extension- 75% with pain  Right  lateral flexion- WNL Left lateral flexion- WNL Right rotation- WNL Left rotation- WNL  LOWER EXTREMITY ROM: WFL bilateral    LOWER EXTREMITY MMT:  MMT Right eval Left eval  Hip flexion 4- w/pain 4-  Hip extension    Hip abduction    Hip adduction    Hip internal rotation    Hip external rotation    Knee flexion 4 4  Knee extension 3+ 3+  Ankle dorsiflexion    Ankle plantarflexion    Ankle inversion    Ankle eversion     (Blank rows = not tested)   FUNCTIONAL TESTS:  5 times sit to stand: 15s from chair but with difficulty  Timed up and go (TUG): 11.65s  GAIT: Distance walked: in clinic  distances Assistive device utilized: Single point cane Level of assistance: Complete Independence                                                                                                                               TREATMENT DATE: 10/09/23 Bike L3 x61mins  Leg ext 5# 2x10 HS curls 20# 2x10 STS 2x10 AR press 10# 2x10 Shoulder ext 10# 2x10 Step ups 6 Calf raises Calf stretch 15s x2 Leg press 20# 2x10  10/01/23  Scifit bike L3 x8 minutes seat 10  Bridge + ABD into red TB 2x12 Sidelying clams red TB 2x12 B  Hooklying TA sets 20x3 seconds Hooklying TA set + march x15 B Hooklying TA set  + opposite alternating UE/LE flexion x20 B Modified quadruped at mat table: TA set + alternating UE raises x20 (overall limited by knee pain in this position) Hip hikes x15 B limited by pain QL stretch forward/right/left 1x30 seconds each     09/25/23 Bike level 4 x 5 minutes Feet on ball K2C, rotation, bridge and isometric abs Ball b/n knees squeeze with bridge Passive stretch of the right LE STM with hands and with t-gun to the right ITB very tender distal ITB  09/23/23:  Bike, L 2.1, 6 min  In ll bars for stability, balance: semitandem standing 30 sec static positioning with lead foot on 2 step,  Also with 15 reps horizontal head turns Towel slides in ll bars for, back, side/side, cw, ccw mass practice each foot Supine with feet over 65 cm physioball, B heel to chest, alt leg lift lifts, bridging Supine therapist assisted B knee to chest stretch and lumbar rotation stretch  Knee ext 10#, 3 x 10    09/20/23 Bike L2 x83mins  Supine LE stretches- HS, glutes, piriformis, ITB Bridges 2x10 STS 2x10 LAQ 3# 2x10 HS curls green 2x10 Step ups 6  09/18/23 EVAL    PATIENT EDUCATION:  Education details: HEP, POC Person educated: Patient Education method: Medical illustrator Education comprehension: verbalized understanding and returned demonstration  HOME EXERCISE  PROGRAM: Access Code: [email or web address] URL: [email or web address] Date: 09/18/2023 Prepared by: Almetta Fam  Exercises - Squat with Chair Touch  -  1 x daily - 7 x weekly - 2 sets - 10 reps - Seated Knee Extension with Resistance  - 1 x daily - 7 x weekly - 2 sets - 10 reps - 3 hold - Seated Knee Lifts with Resistance  - 1 x daily - 7 x weekly - 2 sets - 10 reps - Side Stepping with Resistance at Ankles  - 1 x daily - 7 x weekly - 2 sets - 10 reps - Standing ITB Stretch  - 1 x daily - 7 x weekly - 2 reps - 15 hold  ASSESSMENT:  CLINICAL IMPRESSION:  Arrives today doing okay, having some pain in her R knee today. Worked more on LE strengthening today and added in some core work. Pt reports calf stretch feels good. Did well throughout session no complaints of pain.     FROM EVAL--She had both of her knees replaced less than a year ago. Although she has good range of motion, she does present with weakness in hip flexors and quads. Her pain is more significant on the R side in both the back and knee. MRI shows anterolisthesis at L5-S1 and moderate to severe narrowing which contribute to her ongoing back pain. At eval she is walking with a cane due to having a fall last month, where he R knee gave out on her. Patient has goals to be able to get up and down from the floor, walk over a mile, hike, and travel but her pain is setting her back. She will benefit from skilled PT to address her impairments to be able to attain these goals.    OBJECTIVE IMPAIRMENTS: Abnormal gait, decreased activity tolerance, decreased balance, difficulty walking, decreased ROM, decreased strength, improper body mechanics, and pain.   ACTIVITY LIMITATIONS: carrying, lifting, bending, sitting, standing, squatting, stairs, transfers, and locomotion level  PARTICIPATION LIMITATIONS: cleaning, laundry, shopping, community activity, and yard work  PERSONAL FACTORS: Past/current experiences, Time since onset of  injury/illness/exacerbation, and 1-2 comorbidities: fibromyalgia, HTN, hx of sx are also affecting patient's functional outcome.   REHAB POTENTIAL: Good  CLINICAL DECISION MAKING: Stable/uncomplicated  EVALUATION COMPLEXITY: Low   GOALS: Goals reviewed with patient? Yes  SHORT TERM GOALS: Target date: 10/30/23  Patient will be independent with initial HEP.  Baseline:  Goal status: met 09/25/23  2.  Patient will report 50% improvement in low back and bilateral knee pain to improve QOL.  Baseline: 4/10 pain Goal status: ongoing 09/25/23   LONG TERM GOALS: Target date: 12/11/23  Patient will be independent with advanced/ongoing HEP to improve outcomes and carryover.  Baseline:  Goal status: INITIAL  2.  Patient will be able to complete floor transfer to get in and out of bathtub and return to yoga Baseline: unable to do Goal status: INITIAL  3.  Patient will be able to walk around [place] without rest breaks   Baseline: can walk half of it now Goal status: INITIAL  4.  Patient will demonstrate improved functional strength as demonstrated by 5/5 in weak mm groups. Baseline: 3+ quads, 4 hip flexors Goal status: INITIAL  5.  Patient will report 40/80 or better on LEFS to demonstrate improved functional ability.  Baseline: 20/80 Goal status: INITIAL   6.  Patient will tolerate 30 min of (standing/sitting/walking) to perform hobbies and household chores. Baseline: 10 mins standing tolerance Goal status: INITIAL  PLAN:  PT FREQUENCY: 2x/week  PT DURATION: 12 weeks  PLANNED INTERVENTIONS: 97110-Therapeutic exercises, 97530- Therapeutic activity, [id number]- Neuromuscular re-education, 97535- Self  Care, 02859- Manual therapy, [phone]- Gait training, [phone] (1-2 muscles), [id number] (3+ muscles)- Dry Needling, Patient/Family education, Balance training, Stair training, Taping, Joint mobilization, Joint manipulation, Spinal manipulation, Spinal mobilization, Cryotherapy, and Moist  heat  PLAN FOR NEXT SESSION: low back stretching, LE strengthening, functional tasks (stairs, sit to stand), floor transfer, see how the STM went. Find out if her insurance will cover water  PT/add to cert and schedule it in if so   Insurance does not cover traction, e-stim, ionto, vaso  Ceclia Koker, PT, DPT 10/09/23 9:30 AM

## 2023-10-09 ENCOUNTER — Ambulatory Visit

## 2023-10-09 DIAGNOSIS — M5459 Other low back pain: Secondary | ICD-10-CM

## 2023-10-09 DIAGNOSIS — M6281 Muscle weakness (generalized): Secondary | ICD-10-CM

## 2023-10-09 DIAGNOSIS — M25561 Pain in right knee: Secondary | ICD-10-CM | POA: Diagnosis not present

## 2023-10-09 DIAGNOSIS — G8929 Other chronic pain: Secondary | ICD-10-CM

## 2023-10-11 NOTE — Therapy (Signed)
 OUTPATIENT PHYSICAL THERAPY LOWER EXTREMITY TREATMENT   Patient Name: Kendra Dodson MRN: [id number] DOB:10-31-1960, 63 y.o., female Today's Date: 10/14/2023  END OF SESSION:  PT End of Session - 10/14/23 0849     Visit Number 7    Date for PT Re-Evaluation 12/11/23    Authorization Type [employer]    PT Start Time [phone]    PT Stop Time 0930    PT Time Calculation (min) 41 min    Activity Tolerance Patient tolerated treatment well    Behavior During Therapy [facility] for tasks assessed/performed               Past Medical History:  Diagnosis Date   Asthma    Fibromyalgia    Hypothyroid    Migraines    Past Surgical History:  Procedure Laterality Date   ADENOIDECTOMY     CHOLECYSTECTOMY     Patient Active Problem List   Diagnosis Date Noted   Hypothyroidism 10/26/2019    PCP: Christain Asp  REFERRING PROVIDER: Toribio Higashi  REFERRING DIAG: R ITB Syndrome  THERAPY DIAG:  Other low back pain  Muscle weakness (generalized)  Chronic pain of both knees  Rationale for Evaluation and Treatment: Rehabilitation  ONSET DATE: 08/30/23 referral  SUBJECTIVE:   SUBJECTIVE STATEMENT:  Always in pain but it is getting a little better. The left knee is mostly great, the R one is having pain in the middle of the night. The back pain is always there.    EVAL-I had both of my knees done last year. I was walking with the cane and then stopped but now I am started again. This is because my right knee gave out twice. A month ago I fell on my R knee while out in the yard. I have some pain in my back too and they have been giving me shots. Last one was last month.   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 Yes: NPRS scale: 4.5/10 Pain location: low back and knees  Pain description: chronic, ache, sometimes can be sharp in the knees, pin  pricks in the knee Aggravating factors: standing more than 10 mins, doing too much activity  Relieving factors: nothing really  PRECAUTIONS: None  RED FLAGS: None   WEIGHT BEARING RESTRICTIONS: No  FALLS:  Has patient fallen in last 6 months? Yes. 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  DIAGNOSTIC FINDINGS: Grade 1 anterolisthesis of L5 on S1 with moderate spinal canal narrowing, severe narrowing of the bilateral lateral recesses, and moderate bilateral neural foraminal narrowing.    PATIENT SURVEYS:  LEFS 20/80  COGNITION: Overall cognitive status: Within functional limits for tasks assessed     SENSATION: WFL Light touch: left lateral foot is numb all the time   PALPATION: Some TTP right low back  LUMBAR ROM: Flexion- WFL some pain going down and coming up on  R side Extension- 75% with pain  Right lateral flexion- WNL Left lateral flexion- WNL Right rotation- WNL Left rotation- WNL  LOWER EXTREMITY ROM: WFL bilateral    LOWER EXTREMITY MMT:  MMT Right eval Left eval  Hip flexion 4- w/pain 4-  Hip extension    Hip abduction    Hip adduction    Hip internal rotation    Hip external rotation    Knee flexion 4 4  Knee extension 3+ 3+  Ankle dorsiflexion    Ankle plantarflexion    Ankle inversion    Ankle eversion     (Blank rows = not tested)   FUNCTIONAL TESTS:  5 times sit to stand: 15s from chair but with difficulty  Timed up and go (TUG):  11.65s  GAIT: Distance walked: in clinic distances Assistive device utilized: Single point cane Level of assistance: Complete Independence                                                                                                                               TREATMENT DATE: 10/14/23 Bike L3 x104mins  Resisted gait 30# 4 way x4  Seated row 25# 2x10 Lat pull down 25# 2x10  LE stretches Feet on pball rotations, knees to chest, small bridges  STS with yellow ball chest press 2x10  10/09/23 Bike L3 x90mins  Leg ext 5# 2x10 HS curls 20# 2x10 STS 2x10 AR press 10# 2x10 Shoulder ext 10# 2x10 Step ups 6 Calf raises Calf stretch 15s x2 Leg press 20# 2x10  10/01/23  Scifit bike L3 x8 minutes seat 10  Bridge + ABD into red TB 2x12 Sidelying clams red TB 2x12 B  Hooklying TA sets 20x3 seconds Hooklying TA set + march x15 B Hooklying TA set  + opposite alternating UE/LE flexion x20 B Modified quadruped at mat table: TA set + alternating UE raises x20 (overall limited by knee pain in this position) Hip hikes x15 B limited by pain QL stretch forward/right/left 1x30 seconds each     09/25/23 Bike level 4 x 5 minutes Feet on ball K2C, rotation, bridge and isometric abs Ball b/n knees squeeze with bridge Passive stretch of the right LE STM with hands and with t-gun to the right ITB very tender distal ITB  09/23/23:  Bike, L 2.1, 6 min  In ll bars for stability, balance: semitandem standing 30 sec static positioning with lead foot on 2 step,  Also with 15 reps horizontal head turns Towel slides in ll bars for, back, side/side, cw, ccw mass practice each foot Supine with feet over 65 cm physioball, B heel to chest, alt leg lift lifts, bridging Supine therapist assisted B knee to chest stretch and lumbar rotation stretch  Knee ext 10#, 3 x 10    09/20/23 Bike L2 x63mins  Supine LE stretches- HS, glutes, piriformis, ITB Bridges 2x10 STS 2x10 LAQ 3# 2x10 HS curls green  2x10 Step ups 6  09/18/23  EVAL    PATIENT EDUCATION:  Education details: HEP, POC Person educated: Patient Education method: Medical illustrator Education comprehension: verbalized understanding and returned demonstration  HOME EXERCISE PROGRAM: Access Code: [email or web address] URL: [email or web address] Date: 09/18/2023 Prepared by: Almetta Fam  Exercises - Squat with Chair Touch  - 1 x daily - 7 x weekly - 2 sets - 10 reps - Seated Knee Extension with Resistance  - 1 x daily - 7 x weekly - 2 sets - 10 reps - 3 hold - Seated Knee Lifts with Resistance  - 1 x daily - 7 x weekly - 2 sets - 10 reps - Side Stepping with Resistance at Ankles  - 1 x daily - 7 x weekly - 2 sets - 10 reps - Standing ITB Stretch  - 1 x daily - 7 x weekly - 2 reps - 15 hold  ASSESSMENT:  CLINICAL IMPRESSION:  Arrives today doing okay, reports ongoing pain but that is inevitable. Continued to work on LE and back strengthening today. She is very flexible with supine stretches. Did well throughout session no complaints of pain.     FROM EVAL--She had both of her knees replaced less than a year ago. Although she has good range of motion, she does present with weakness in hip flexors and quads. Her pain is more significant on the R side in both the back and knee. MRI shows anterolisthesis at L5-S1 and moderate to severe narrowing which contribute to her ongoing back pain. At eval she is walking with a cane due to having a fall last month, where he R knee gave out on her. Patient has goals to be able to get up and down from the floor, walk over a mile, hike, and travel but her pain is setting her back. She will benefit from skilled PT to address her impairments to be able to attain these goals.    OBJECTIVE IMPAIRMENTS: Abnormal gait, decreased activity tolerance, decreased balance, difficulty walking, decreased ROM, decreased strength, improper body mechanics, and pain.   ACTIVITY LIMITATIONS:  carrying, lifting, bending, sitting, standing, squatting, stairs, transfers, and locomotion level  PARTICIPATION LIMITATIONS: cleaning, laundry, shopping, community activity, and yard work  PERSONAL FACTORS: Past/current experiences, Time since onset of injury/illness/exacerbation, and 1-2 comorbidities: fibromyalgia, HTN, hx of sx are also affecting patient's functional outcome.   REHAB POTENTIAL: Good  CLINICAL DECISION MAKING: Stable/uncomplicated  EVALUATION COMPLEXITY: Low   GOALS: Goals reviewed with patient? Yes  SHORT TERM GOALS: Target date: 10/30/23  Patient will be independent with initial HEP.  Baseline:  Goal status: met 09/25/23  2.  Patient will report 50% improvement in low back and bilateral knee pain to improve QOL.  Baseline: 4/10 pain Goal status: ongoing 09/25/23   LONG TERM GOALS: Target date: 12/11/23  Patient will be independent with advanced/ongoing HEP to improve outcomes and carryover.  Baseline:  Goal status: INITIAL  2.  Patient will be able to complete floor transfer to get in and out of bathtub and return to yoga Baseline: unable to do Goal status: INITIAL  3.  Patient will be able to walk around [place] without rest breaks   Baseline: can walk half of it now Goal status: INITIAL  4.  Patient will demonstrate improved functional strength as demonstrated by 5/5 in weak mm groups. Baseline: 3+ quads, 4 hip flexors Goal status: INITIAL  5.  Patient will report 40/80 or better on LEFS to demonstrate improved functional ability.  Baseline: 20/80 Goal status: INITIAL  6.  Patient will tolerate 30 min of (standing/sitting/walking) to perform hobbies and household chores. Baseline: 10 mins standing tolerance Goal status: INITIAL  PLAN:  PT FREQUENCY: 2x/week  PT DURATION: 12 weeks  PLANNED INTERVENTIONS: 97110-Therapeutic exercises, 97530- Therapeutic activity, [id number]- Neuromuscular re-education, 97535- Self Care, [id number]- Manual therapy,  [id number]- Gait training, [phone] (1-2 muscles), 20561 (3+ muscles)- Dry Needling, Patient/Family education, Balance training, Stair training, Taping, Joint mobilization, Joint manipulation, Spinal manipulation, Spinal mobilization, Cryotherapy, and Moist heat  PLAN FOR NEXT SESSION: low back stretching, LE strengthening, functional tasks (stairs, sit to stand), floor transfer, see how the STM went. Find out if her insurance will cover water  PT/add to cert and schedule it in if so   Insurance does not cover traction, e-stim, ionto, vaso  Zamier Eggebrecht, PT, DPT 10/14/23 9:29 AM

## 2023-10-14 ENCOUNTER — Ambulatory Visit: Attending: Orthopedic Surgery

## 2023-10-14 DIAGNOSIS — G8929 Other chronic pain: Secondary | ICD-10-CM | POA: Insufficient documentation

## 2023-10-14 DIAGNOSIS — M6281 Muscle weakness (generalized): Secondary | ICD-10-CM | POA: Insufficient documentation

## 2023-10-14 DIAGNOSIS — M25561 Pain in right knee: Secondary | ICD-10-CM | POA: Diagnosis present

## 2023-10-14 DIAGNOSIS — M25562 Pain in left knee: Secondary | ICD-10-CM | POA: Insufficient documentation

## 2023-10-14 DIAGNOSIS — M5459 Other low back pain: Secondary | ICD-10-CM | POA: Diagnosis present

## 2023-10-15 NOTE — Therapy (Signed)
 OUTPATIENT PHYSICAL THERAPY LOWER EXTREMITY TREATMENT   Patient Name: Kendra Dodson MRN: [id number] DOB:October 21, 1960, 63 y.o., female Today's Date: 10/16/2023  END OF SESSION:  PT End of Session - 10/16/23 0846     Visit Number 8    Date for PT Re-Evaluation 12/11/23    Authorization Type [employer]    PT Start Time [phone]    PT Stop Time 0930    PT Time Calculation (min) 44 min    Activity Tolerance Patient tolerated treatment well    Behavior During Therapy [facility] for tasks assessed/performed                Past Medical History:  Diagnosis Date   Asthma    Fibromyalgia    Hypothyroid    Migraines    Past Surgical History:  Procedure Laterality Date   ADENOIDECTOMY     CHOLECYSTECTOMY     Patient Active Problem List   Diagnosis Date Noted   Hypothyroidism 10/26/2019    PCP: Christain Asp  REFERRING PROVIDER: Toribio Higashi  REFERRING DIAG: R ITB Syndrome  THERAPY DIAG:  Other low back pain  Muscle weakness (generalized)  Chronic pain of both knees  Rationale for Evaluation and Treatment: Rehabilitation  ONSET DATE: 08/30/23 referral  SUBJECTIVE:   SUBJECTIVE STATEMENT:  I am doing alright.   EVAL-I had both of my knees done last year. I was walking with the cane and then stopped but now I am started again. This is because my right knee gave out twice. A month ago I fell on my R knee while out in the yard. I have some pain in my back too and they have been giving me shots. Last one was last month.   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 Yes: NPRS scale: 4.5/10 Pain location: low back and knees  Pain description: chronic, ache, sometimes can be sharp in the knees, pin pricks in the knee Aggravating factors: standing more than 10 mins, doing too much activity  Relieving factors: nothing  really  PRECAUTIONS: None  RED FLAGS: None   WEIGHT BEARING RESTRICTIONS: No  FALLS:  Has patient fallen in last 6 months? Yes. 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  DIAGNOSTIC FINDINGS: Grade 1 anterolisthesis of L5 on S1 with moderate spinal canal narrowing, severe narrowing of the bilateral lateral recesses, and moderate bilateral neural foraminal narrowing.    PATIENT SURVEYS:  LEFS 20/80  COGNITION: Overall cognitive status: Within functional limits for tasks assessed     SENSATION: WFL Light touch: left lateral foot is numb all the time   PALPATION: Some TTP right low back  LUMBAR ROM: Flexion- WFL some pain going down and coming up on R side Extension- 75% with pain  Right lateral flexion- WNL Left lateral flexion- WNL Right rotation- WNL Left rotation- WNL  LOWER EXTREMITY ROM: WFL bilateral  LOWER EXTREMITY MMT:  MMT Right eval Left eval  Hip flexion 4- w/pain 4-  Hip extension    Hip abduction    Hip adduction    Hip internal rotation    Hip external rotation    Knee flexion 4 4  Knee extension 3+ 3+  Ankle dorsiflexion    Ankle plantarflexion    Ankle inversion    Ankle eversion     (Blank rows = not tested)   FUNCTIONAL TESTS:  5 times sit to stand: 15s from chair but with difficulty  Timed up and go (TUG): 11.65s  GAIT: Distance walked: in clinic distances Assistive device utilized: Single point cane Level of assistance: Complete  Independence                                                                                                                               TREATMENT DATE: 10/16/23 NuStep L5x92mins  Step ups 6 Walking on beam  Balance on beam tandem and SLS Lateral band steps  Leg press 20# 2x10 Shoulder ext 5# 2x10   10/14/23 Bike L3 x94mins  Resisted gait 30# 4 way x4  Seated row 25# 2x10 Lat pull down 25# 2x10  LE stretches Feet on pball rotations, knees to chest, small bridges  STS with yellow ball chest press 2x10  10/09/23 Bike L3 x50mins  Leg ext 5# 2x10 HS curls 20# 2x10 STS 2x10 AR press 10# 2x10 Shoulder ext 10# 2x10 Step ups 6 Calf raises Calf stretch 15s x2 Leg press 20# 2x10  10/01/23  Scifit bike L3 x8 minutes seat 10  Bridge + ABD into red TB 2x12 Sidelying clams red TB 2x12 B  Hooklying TA sets 20x3 seconds Hooklying TA set + march x15 B Hooklying TA set  + opposite alternating UE/LE flexion x20 B Modified quadruped at mat table: TA set + alternating UE raises x20 (overall limited by knee pain in this position) Hip hikes x15 B limited by pain QL stretch forward/right/left 1x30 seconds each     09/25/23 Bike level 4 x 5 minutes Feet on ball K2C, rotation, bridge and isometric abs Ball b/n knees squeeze with bridge Passive stretch of the right LE STM with hands and with t-gun to the right ITB very tender distal ITB  09/23/23:  Bike, L 2.1, 6 min  In ll bars for stability, balance: semitandem standing 30 sec static positioning with lead foot on 2 step,  Also with 15 reps horizontal head turns Towel slides in ll bars for, back, side/side, cw, ccw mass practice each foot Supine with feet over 65 cm physioball, B heel to chest, alt leg lift lifts, bridging Supine therapist assisted B knee to chest stretch and lumbar rotation stretch  Knee ext 10#, 3 x 10    09/20/23 Bike L2 x39mins  Supine LE stretches- HS, glutes, piriformis, ITB Bridges 2x10 STS 2x10 LAQ 3#  2x10 HS curls green 2x10 Step ups 6  09/18/23 EVAL    PATIENT EDUCATION:  Education details: HEP, POC Person educated: Patient Education method: Medical illustrator Education comprehension: verbalized understanding and returned demonstration  HOME EXERCISE PROGRAM: Access Code: [email or web address] URL: [email or web address] Date: 09/18/2023 Prepared by: Almetta Fam  Exercises - Squat with Chair Touch  - 1 x daily - 7 x weekly - 2 sets - 10 reps - Seated Knee Extension with Resistance  - 1 x daily - 7 x weekly - 2 sets - 10 reps - 3 hold - Seated Knee Lifts with Resistance  - 1 x daily - 7 x weekly - 2 sets - 10 reps - Side Stepping with Resistance at Ankles  - 1 x daily - 7 x weekly - 2 sets - 10 reps - Standing ITB Stretch  - 1 x daily - 7 x weekly - 2 reps - 15 hold  ASSESSMENT:  CLINICAL IMPRESSION:  Arrives today doing okay, reports ongoing pain but that is inevitable due to fibromyalgia. Continued to work on general strengthening and added in some balance today. Did well throughout session no complaints of pain, some fatigue in hips with lateral band steps.     FROM EVAL--She had both of her knees replaced less than a year ago. Although she has good range of motion, she does present with weakness in hip flexors and quads. Her pain is more significant on the R side in both the back and knee. MRI shows anterolisthesis at L5-S1 and moderate to severe narrowing which contribute to her ongoing back pain. At eval she is walking with a cane due to having a fall last month, where he R knee gave out on her. Patient has goals to be able to get up and down from the floor, walk over a mile, hike, and travel but her pain is setting her back. She will benefit from skilled PT to address her impairments to be able to attain these goals.    OBJECTIVE IMPAIRMENTS: Abnormal gait, decreased activity tolerance, decreased balance, difficulty walking, decreased ROM, decreased strength,  improper body mechanics, and pain.   ACTIVITY LIMITATIONS: carrying, lifting, bending, sitting, standing, squatting, stairs, transfers, and locomotion level  PARTICIPATION LIMITATIONS: cleaning, laundry, shopping, community activity, and yard work  PERSONAL FACTORS: Past/current experiences, Time since onset of injury/illness/exacerbation, and 1-2 comorbidities: fibromyalgia, HTN, hx of sx are also affecting patient's functional outcome.   REHAB POTENTIAL: Good  CLINICAL DECISION MAKING: Stable/uncomplicated  EVALUATION COMPLEXITY: Low   GOALS: Goals reviewed with patient? Yes  SHORT TERM GOALS: Target date: 10/30/23  Patient will be independent with initial HEP.  Baseline:  Goal status: met 09/25/23  2.  Patient will report 50% improvement in low back and bilateral knee pain to improve QOL.  Baseline: 4/10 pain Goal status: ongoing 09/25/23   LONG TERM GOALS: Target date: 12/11/23  Patient will be independent with advanced/ongoing HEP to improve outcomes and carryover.  Baseline:  Goal status: INITIAL  2.  Patient will be able to complete floor transfer to get in and out of bathtub and return to yoga Baseline: unable to do Goal status: IN PROGRESS can get up and down from the ground  3.  Patient will be able to walk around [place] without rest breaks   Baseline: can walk half of it now Goal status: INITIAL  4.  Patient will demonstrate improved functional strength as demonstrated by 5/5 in weak mm groups. Baseline: 3+ quads, 4 hip flexors Goal status: INITIAL  5.  Patient will report 40/80  or better on LEFS to demonstrate improved functional ability.  Baseline: 20/80 Goal status: IN PROGRESS 34/80 10/16/23   6.  Patient will tolerate 30 min of (standing/sitting/walking) to perform hobbies and household chores. Baseline: 10 mins standing tolerance Goal status: IN PROGRESS   PLAN:  PT FREQUENCY: 2x/week  PT DURATION: 12 weeks  PLANNED INTERVENTIONS:  97110-Therapeutic exercises, 97530- Therapeutic activity, [id number]- Neuromuscular re-education, 97535- Self Care, 02859- Manual therapy, [phone]- Gait training, [phone] (1-2 muscles), [id number] (3+ muscles)- Dry Needling, Patient/Family education, Balance training, Stair training, Taping, Joint mobilization, Joint manipulation, Spinal manipulation, Spinal mobilization, Cryotherapy, and Moist heat  PLAN FOR NEXT SESSION: low back stretching, LE strengthening, functional tasks (stairs, sit to stand), floor transfer, see how the STM went. Find out if her insurance will cover water  PT/add to cert and schedule it in if so   Insurance does not cover traction, e-stim, ionto, vaso  Derrico Zhong, PT, DPT 10/16/23 9:30 AM

## 2023-10-16 ENCOUNTER — Ambulatory Visit

## 2023-10-16 DIAGNOSIS — M6281 Muscle weakness (generalized): Secondary | ICD-10-CM

## 2023-10-16 DIAGNOSIS — M5459 Other low back pain: Secondary | ICD-10-CM

## 2023-10-16 DIAGNOSIS — G8929 Other chronic pain: Secondary | ICD-10-CM

## 2023-10-23 ENCOUNTER — Ambulatory Visit: Admitting: Physical Therapy

## 2023-10-25 ENCOUNTER — Ambulatory Visit: Admitting: Physical Therapy

## 2023-10-28 ENCOUNTER — Ambulatory Visit: Admitting: Physical Therapy

## 2023-10-30 ENCOUNTER — Ambulatory Visit: Admitting: Physical Therapy

## 2023-11-04 ENCOUNTER — Ambulatory Visit: Admitting: Physical Therapy

## 2023-11-06 ENCOUNTER — Ambulatory Visit: Admitting: Physical Therapy

## 2023-11-12 ENCOUNTER — Ambulatory Visit: Payer: Self-pay

## 2023-11-12 ENCOUNTER — Encounter (HOSPITAL_COMMUNITY): Payer: Self-pay

## 2023-11-12 DIAGNOSIS — M4317 Spondylolisthesis, lumbosacral region: Secondary | ICD-10-CM

## 2023-11-12 NOTE — Progress Notes (Addendum)
 Surgical Instructions   Your procedure is scheduled on Friday, November 15, 2023. Report to [facility] Main Entrance A at 10:15 A.M., then check in with the Admitting office. Any questions or running late day of surgery: call [phone]  Questions prior to your surgery date: call [phone], Monday-Friday, 8am-4pm. If you experience any cold or flu symptoms such as cough, fever, chills, shortness of breath, etc. between now and your scheduled surgery, please notify us  at the above number.    Remember:  Do not eat or drink after midnight the night before your surgery  Take these medicines the morning of surgery with A SIP OF WATER   atorvastatin  (LIPITOR)  busPIRone (BUSPAR)  levothyroxine  (SYNTHROID )  loratadine (CLARITIN)  venlafaxine XR (EFFEXOR-XR)   May take these medicines IF NEEDED: acetaminophen  (TYLENOL )  carboxymethylcellulose 1 % ophthalmic solution  NURTEC  rizatriptan (MAXALT)   One week prior to surgery, STOP taking any Aspirin (unless otherwise instructed by your surgeon) Aleve, Naproxen, Ibuprofen, Motrin, Advil, Goody's, BC's, all herbal medications, fish oil, and non-prescription vitamins.                     Do NOT Smoke (Tobacco/Vaping) for 24 hours prior to your procedure.  If you use a CPAP at night, you may bring your mask/headgear for your overnight stay.   You will be asked to remove any contacts, glasses, piercing's, hearing aid's, dentures/partials prior to surgery. Please bring cases for these items if needed.    Patients discharged the day of surgery will not be allowed to drive home, and someone needs to stay with them for 24 hours.  SURGICAL WAITING ROOM VISITATION Patients may have no more than 2 support people in the waiting area - these visitors may rotate.   Pre-op nurse will coordinate an appropriate time for 1 ADULT support person, who may not rotate, to accompany patient in pre-op.  Children under the age of 91 must have an adult with  them who is not the patient and must remain in the main waiting area with an adult.  If the patient needs to stay at the hospital during part of their recovery, the visitor guidelines for inpatient rooms apply.  Please refer to the [facility] website for the visitor guidelines for any additional information.   If you received a COVID test during your pre-op visit  it is requested that you wear a mask when out in public, stay away from anyone that may not be feeling well and notify your surgeon if you develop symptoms. If you have been in contact with anyone that has tested positive in the last 10 days please notify you surgeon.      Pre-operative 5 CHG Bathing Instructions   You can play a key role in reducing the risk of infection after surgery. Your skin needs to be as free of germs as possible. You can reduce the number of germs on your skin by washing with CHG (chlorhexidine  gluconate) soap before surgery. CHG is an antiseptic soap that kills germs and continues to kill germs even after washing.   DO NOT use if you have an allergy to chlorhexidine /CHG or antibacterial soaps. If your skin becomes reddened or irritated, stop using the CHG and notify one of our RNs at [phone].   Please shower with the CHG soap starting 4 days before surgery using the following schedule:     Please keep in mind the following:  DO NOT shave, including legs and underarms, starting  the day of your first shower.   Place clean sheets on your bed the day you start using CHG soap. Use a clean washcloth (not used since being washed) for each shower. DO NOT sleep with pets once you start using the CHG.   CHG Shower Instructions:  Wash your face and private area with normal soap. If you choose to wash your hair, wash first with your normal shampoo.  After you use shampoo/soap, rinse your hair and body thoroughly to remove shampoo/soap residue.  Turn the water  OFF and apply about 3 tablespoons (45 ml) of  CHG soap to a CLEAN washcloth.  Apply CHG soap ONLY FROM YOUR NECK DOWN TO YOUR TOES (washing for 3-5 minutes)  DO NOT use CHG soap on face, private areas, open wounds, or sores.  Pay special attention to the area where your surgery is being performed.  If you are having back surgery, having someone wash your back for you may be helpful. Wait 2 minutes after CHG soap is applied, then you may rinse off the CHG soap.  Pat dry with a clean towel  Put on clean clothes/pajamas   If you choose to wear lotion, please use ONLY the CHG-compatible lotions that are listed below.  Additional instructions for the day of surgery: DO NOT APPLY any lotions, deodorants or perfumes.   Do not bring valuables to the hospital. [facility] is not responsible for any belongings/valuables. Do not wear nail polish, gel polish, artificial nails, or any other type of covering on natural nails (fingers and toes) Do not wear jewelry or makeup Put on clean/comfortable clothes.  Please brush your teeth.  Ask your nurse before applying any prescription medications to the skin.     CHG Compatible Lotions   Aveeno Moisturizing lotion  Cetaphil Moisturizing Cream  Cetaphil Moisturizing Lotion  Clairol Herbal Essence Moisturizing Lotion, Dry Skin  Clairol Herbal Essence Moisturizing Lotion, Extra Dry Skin  Clairol Herbal Essence Moisturizing Lotion, Normal Skin  Curel Age Defying Therapeutic Moisturizing Lotion with Alpha Hydroxy  Curel Extreme Care Body Lotion  Curel Soothing Hands Moisturizing Hand Lotion  Curel Therapeutic Moisturizing Cream, Fragrance-Free  Curel Therapeutic Moisturizing Lotion, Fragrance-Free  Curel Therapeutic Moisturizing Lotion, Original Formula  Eucerin Daily Replenishing Lotion  Eucerin Dry Skin Therapy Plus Alpha Hydroxy Crme  Eucerin Dry Skin Therapy Plus Alpha Hydroxy Lotion  Eucerin Original Crme  Eucerin Original Lotion  Eucerin Plus Crme Eucerin Plus Lotion  Eucerin TriLipid  Replenishing Lotion  Keri Anti-Bacterial Hand Lotion  Keri Deep Conditioning Original Lotion Dry Skin Formula Softly Scented  Keri Deep Conditioning Original Lotion, Fragrance Free Sensitive Skin Formula  Keri Lotion Fast Absorbing Fragrance Free Sensitive Skin Formula  Keri Lotion Fast Absorbing Softly Scented Dry Skin Formula  Keri Original Lotion  Keri Skin Renewal Lotion Keri Silky Smooth Lotion  Keri Silky Smooth Sensitive Skin Lotion  Nivea Body Creamy Conditioning Oil  Nivea Body Extra Enriched Lotion  Nivea Body Original Lotion  Nivea Body Sheer Moisturizing Lotion Nivea Crme  Nivea Skin Firming Lotion  NutraDerm 30 Skin Lotion  NutraDerm Skin Lotion  NutraDerm Therapeutic Skin Cream  NutraDerm Therapeutic Skin Lotion  ProShield Protective Hand Cream  Provon moisturizing lotion  Please read over the following fact sheets that you were given.

## 2023-11-13 ENCOUNTER — Encounter (HOSPITAL_COMMUNITY)
Admission: RE | Admit: 2023-11-13 | Discharge: 2023-11-13 | Disposition: A | Discharge status: Home / Self Care | Source: Ambulatory Visit

## 2023-11-13 ENCOUNTER — Encounter (HOSPITAL_COMMUNITY): Payer: Self-pay

## 2023-11-13 ENCOUNTER — Other Ambulatory Visit: Payer: Self-pay

## 2023-11-13 VITALS — BP 127/85 | HR 100 | Temp 97.9°F | Resp 17 | Ht 64.0 in | Wt 175.2 lb

## 2023-11-13 DIAGNOSIS — Z01818 Encounter for other preprocedural examination: Secondary | ICD-10-CM | POA: Diagnosis present

## 2023-11-13 DIAGNOSIS — M4317 Spondylolisthesis, lumbosacral region: Secondary | ICD-10-CM | POA: Insufficient documentation

## 2023-11-13 HISTORY — DX: Hyperlipidemia, unspecified: E78.5

## 2023-11-13 HISTORY — DX: Essential (primary) hypertension: I10

## 2023-11-13 HISTORY — DX: Post-traumatic stress disorder, unspecified: F43.10

## 2023-11-13 LAB — CBC
HCT: 43.9 % (ref 36.0–46.0)
Hemoglobin: 14.3 g/dL (ref 12.0–15.0)
MCH: 32.1 pg (ref 26.0–34.0)
MCHC: 32.6 g/dL (ref 30.0–36.0)
MCV: 98.7 fL (ref 80.0–100.0)
Platelets: 264 K/uL (ref 150–400)
RBC: 4.45 MIL/uL (ref 3.87–5.11)
RDW: 13.3 % (ref 11.5–15.5)
WBC: 6.7 K/uL (ref 4.0–10.5)
nRBC: 0 % (ref 0.0–0.2)

## 2023-11-13 LAB — TYPE AND SCREEN
ABO/RH(D): O NEG
Antibody Screen: NEGATIVE

## 2023-11-13 LAB — SURGICAL PCR SCREEN
MRSA, PCR: NEGATIVE
Staphylococcus aureus: NEGATIVE

## 2023-11-13 LAB — BASIC METABOLIC PANEL WITH GFR
Anion gap: 9 (ref 5–15)
BUN: 15 mg/dL (ref 8–23)
CO2: 26 mmol/L (ref 22–32)
Calcium: 9.6 mg/dL (ref 8.9–10.3)
Chloride: 102 mmol/L (ref 98–111)
Creatinine, Ser: 0.9 mg/dL (ref 0.44–1.00)
GFR, Estimated: >60 mL/min (ref >60)
Glucose, Bld: 101 mg/dL — ABNORMAL HIGH (ref 70–99)
Potassium: 4 mmol/L (ref 3.5–5.1)
Sodium: 137 mmol/L (ref 135–145)

## 2023-11-13 NOTE — Progress Notes (Signed)
 PCP - Worth Kirschner, [facility] Cardiologist - denies  PPM/ICD - denies Device Orders - na Rep Notified - na  Chest x-ray - na EKG - PAT, 11/13/2023 Stress Test -  ECHO -  20-25 years ago in [place]  Cardiac Cath -   Sleep Study - denies CPAP - na  Non-diabetic  Blood Thinner Instructions:denies Aspirin Instructions:denies  ERAS Protcol -NPO  Anesthesia review: NO  Patient denies shortness of breath, fever, cough and chest pain at PAT appointment   All instructions explained to the patient, with a verbal understanding of the material. Patient agrees to go over the instructions while at home for a better understanding. Patient also instructed to self quarantine after being tested for COVID-19. The opportunity to ask questions was provided.

## 2023-11-15 ENCOUNTER — Ambulatory Visit (HOSPITAL_COMMUNITY): Admitting: Anesthesiology

## 2023-11-15 ENCOUNTER — Ambulatory Visit (HOSPITAL_BASED_OUTPATIENT_CLINIC_OR_DEPARTMENT_OTHER): Admitting: Anesthesiology

## 2023-11-15 ENCOUNTER — Ambulatory Visit (HOSPITAL_COMMUNITY)

## 2023-11-15 ENCOUNTER — Ambulatory Visit (HOSPITAL_COMMUNITY): Admission: RE | Disposition: A | Discharge status: Home / Self Care | Payer: Self-pay | Source: Home / Self Care

## 2023-11-15 ENCOUNTER — Other Ambulatory Visit: Payer: Self-pay

## 2023-11-15 ENCOUNTER — Ambulatory Visit (HOSPITAL_COMMUNITY): Admission: RE | Admit: 2023-11-15 | Discharge: 2023-11-15 | Disposition: A | Discharge status: Home / Self Care

## 2023-11-15 ENCOUNTER — Encounter (HOSPITAL_COMMUNITY): Payer: Self-pay

## 2023-11-15 DIAGNOSIS — E039 Hypothyroidism, unspecified: Secondary | ICD-10-CM | POA: Insufficient documentation

## 2023-11-15 DIAGNOSIS — F419 Anxiety disorder, unspecified: Secondary | ICD-10-CM | POA: Diagnosis not present

## 2023-11-15 DIAGNOSIS — M4316 Spondylolisthesis, lumbar region: Secondary | ICD-10-CM | POA: Diagnosis present

## 2023-11-15 DIAGNOSIS — M4317 Spondylolisthesis, lumbosacral region: Secondary | ICD-10-CM | POA: Insufficient documentation

## 2023-11-15 DIAGNOSIS — Z79890 Hormone replacement therapy (postmenopausal): Secondary | ICD-10-CM | POA: Insufficient documentation

## 2023-11-15 DIAGNOSIS — E785 Hyperlipidemia, unspecified: Secondary | ICD-10-CM | POA: Insufficient documentation

## 2023-11-15 DIAGNOSIS — E669 Obesity, unspecified: Secondary | ICD-10-CM | POA: Diagnosis not present

## 2023-11-15 DIAGNOSIS — M5417 Radiculopathy, lumbosacral region: Secondary | ICD-10-CM | POA: Diagnosis not present

## 2023-11-15 DIAGNOSIS — Z833 Family history of diabetes mellitus: Secondary | ICD-10-CM | POA: Insufficient documentation

## 2023-11-15 DIAGNOSIS — R519 Headache, unspecified: Secondary | ICD-10-CM | POA: Insufficient documentation

## 2023-11-15 DIAGNOSIS — M797 Fibromyalgia: Secondary | ICD-10-CM | POA: Insufficient documentation

## 2023-11-15 DIAGNOSIS — Z6830 Body mass index (BMI) 30.0-30.9, adult: Secondary | ICD-10-CM | POA: Insufficient documentation

## 2023-11-15 DIAGNOSIS — Z79899 Other long term (current) drug therapy: Secondary | ICD-10-CM | POA: Diagnosis not present

## 2023-11-15 DIAGNOSIS — I10 Essential (primary) hypertension: Secondary | ICD-10-CM | POA: Diagnosis not present

## 2023-11-15 DIAGNOSIS — J45909 Unspecified asthma, uncomplicated: Secondary | ICD-10-CM | POA: Insufficient documentation

## 2023-11-15 DIAGNOSIS — M48062 Spinal stenosis, lumbar region with neurogenic claudication: Secondary | ICD-10-CM | POA: Diagnosis present

## 2023-11-15 DIAGNOSIS — M5416 Radiculopathy, lumbar region: Secondary | ICD-10-CM | POA: Diagnosis not present

## 2023-11-15 HISTORY — PX: TRANSFORAMINAL LUMBAR INTERBODY FUSION (TLIF) WITH PEDICLE SCREW FIXATION 1 LEVEL: SHX6141

## 2023-11-15 LAB — ABO/RH: ABO/RH(D): O NEG

## 2023-11-15 SURGERY — TRANSFORAMINAL LUMBAR INTERBODY FUSION (TLIF) WITH PEDICLE SCREW FIXATION 1 LEVEL
Anesthesia: General

## 2023-11-15 MED ORDER — LIDOCAINE-EPINEPHRINE 1 %-1:100000 IJ SOLN
INTRAMUSCULAR | Status: AC
Start: 2023-11-15 — End: 2023-11-15
  Filled 2023-11-15: qty 1

## 2023-11-15 MED ORDER — THROMBIN 5000 UNITS EX SOLR
OROMUCOSAL | Status: DC | PRN
  Administered 2023-11-15 (×2): 5 mL

## 2023-11-15 MED ORDER — FENTANYL CITRATE (PF) 100 MCG/2ML IJ SOLN
25.0000 ug | INTRAMUSCULAR | Status: DC | PRN
  Administered 2023-11-15: 50 ug via INTRAVENOUS
  Administered 2023-11-15: 25 ug via INTRAVENOUS

## 2023-11-15 MED ORDER — BUPIVACAINE-EPINEPHRINE (PF) 0.25% -1:200000 IJ SOLN
INTRAMUSCULAR | Status: AC
  Filled 2023-11-15: qty 60

## 2023-11-15 MED ORDER — SODIUM CHLORIDE 0.9 % IV SOLN
0.1500 ug/kg/min | INTRAVENOUS | Status: DC
  Administered 2023-11-15: .05 ug/kg/min via INTRAVENOUS
  Filled 2023-11-15: qty 2000

## 2023-11-15 MED ORDER — CEFAZOLIN SODIUM-DEXTROSE 2-4 GM/100ML-% IV SOLN
2.0000 g | INTRAVENOUS | Status: AC
  Administered 2023-11-15: 2 g via INTRAVENOUS
  Filled 2023-11-15: qty 100

## 2023-11-15 MED ORDER — ACETAMINOPHEN 500 MG PO TABS
1000.0000 mg | ORAL_TABLET | Freq: Once | ORAL | Status: DC
  Filled 2023-11-15: qty 2

## 2023-11-15 MED ORDER — ONDANSETRON HCL 4 MG/2ML IJ SOLN
INTRAMUSCULAR | Status: DC | PRN
  Administered 2023-11-15: 4 mg via INTRAVENOUS

## 2023-11-15 MED ORDER — AMISULPRIDE (ANTIEMETIC) 5 MG/2ML IV SOLN: 10.0000 mg | Freq: Once | INTRAVENOUS | Status: DC | PRN

## 2023-11-15 MED ORDER — OXYCODONE HCL 5 MG/5ML PO SOLN: 5.0000 mg | Freq: Once | ORAL | Status: DC | PRN

## 2023-11-15 MED ORDER — THROMBIN 20000 UNITS EX SOLR
CUTANEOUS | Status: AC
Start: 2023-11-15 — End: 2023-11-15
  Filled 2023-11-15: qty 20000

## 2023-11-15 MED ORDER — SODIUM CHLORIDE 0.9 % IV SOLN: 12.5000 mg | INTRAVENOUS | Status: DC | PRN

## 2023-11-15 MED ORDER — THROMBIN 5000 UNITS EX KIT
PACK | CUTANEOUS | Status: AC
  Filled 2023-11-15: qty 1

## 2023-11-15 MED ORDER — FENTANYL CITRATE (PF) 250 MCG/5ML IJ SOLN
INTRAMUSCULAR | Status: DC | PRN
  Administered 2023-11-15 (×5): 50 ug via INTRAVENOUS

## 2023-11-15 MED ORDER — BUPIVACAINE HCL (PF) 0.5 % IJ SOLN
INTRAMUSCULAR | Status: AC
  Filled 2023-11-15: qty 30

## 2023-11-15 MED ORDER — MIDAZOLAM HCL 2 MG/2ML IJ SOLN
INTRAMUSCULAR | Status: DC | PRN
  Administered 2023-11-15: 2 mg via INTRAVENOUS

## 2023-11-15 MED ORDER — FENTANYL CITRATE (PF) 250 MCG/5ML IJ SOLN
INTRAMUSCULAR | Status: AC
  Filled 2023-11-15: qty 5

## 2023-11-15 MED ORDER — PROPOFOL 500 MG/50ML IV EMUL
INTRAVENOUS | Status: DC | PRN
  Administered 2023-11-15: 100 ug/kg/min via INTRAVENOUS

## 2023-11-15 MED ORDER — CELECOXIB 200 MG PO CAPS
200.0000 mg | ORAL_CAPSULE | Freq: Once | ORAL | Status: AC
  Administered 2023-11-15: 200 mg via ORAL
  Filled 2023-11-15: qty 1

## 2023-11-15 MED ORDER — PHENYLEPHRINE 80 MCG/ML (10ML) SYRINGE FOR IV PUSH (FOR BLOOD PRESSURE SUPPORT)
PREFILLED_SYRINGE | INTRAVENOUS | Status: AC
  Filled 2023-11-15: qty 10

## 2023-11-15 MED ORDER — ROCURONIUM BROMIDE 10 MG/ML (PF) SYRINGE
PREFILLED_SYRINGE | INTRAVENOUS | Status: AC
  Filled 2023-11-15: qty 10

## 2023-11-15 MED ORDER — PHENYLEPHRINE HCL-NACL 20-0.9 MG/250ML-% IV SOLN
INTRAVENOUS | Status: DC | PRN
  Administered 2023-11-15: 40 ug/min via INTRAVENOUS

## 2023-11-15 MED ORDER — LIDOCAINE 2% (20 MG/ML) 5 ML SYRINGE
INTRAMUSCULAR | Status: DC | PRN
  Administered 2023-11-15: 60 mg via INTRAVENOUS

## 2023-11-15 MED ORDER — OXYCODONE HCL 5 MG PO TABS: 5.0000 mg | ORAL_TABLET | Freq: Once | ORAL | Status: DC | PRN

## 2023-11-15 MED ORDER — 0.9 % SODIUM CHLORIDE (POUR BTL) OPTIME
TOPICAL | Status: DC | PRN
  Administered 2023-11-15: 1000 mL

## 2023-11-15 MED ORDER — KETAMINE HCL 50 MG/5ML IJ SOSY
PREFILLED_SYRINGE | INTRAMUSCULAR | Status: AC
  Filled 2023-11-15: qty 5

## 2023-11-15 MED ORDER — VANCOMYCIN HCL 1000 MG IV SOLR
INTRAVENOUS | Status: DC | PRN
  Administered 2023-11-15: 1000 mg

## 2023-11-15 MED ORDER — SUCCINYLCHOLINE CHLORIDE 200 MG/10ML IV SOSY
PREFILLED_SYRINGE | INTRAVENOUS | Status: DC | PRN
  Administered 2023-11-15: 120 mg via INTRAVENOUS

## 2023-11-15 MED ORDER — TRANEXAMIC ACID-NACL 1000-0.7 MG/100ML-% IV SOLN
1000.0000 mg | INTRAVENOUS | Status: AC
  Administered 2023-11-15: 1000 mg via INTRAVENOUS
  Filled 2023-11-15: qty 100

## 2023-11-15 MED ORDER — OXYCODONE-ACETAMINOPHEN 5-325 MG PO TABS: 1.0000 | ORAL_TABLET | ORAL | 0 refills | Status: AC | PRN

## 2023-11-15 MED ORDER — BUPIVACAINE LIPOSOME 1.3 % IJ SUSP
INTRAMUSCULAR | Status: DC | PRN
  Administered 2023-11-15: 20 mL

## 2023-11-15 MED ORDER — ONDANSETRON HCL 4 MG/2ML IJ SOLN
INTRAMUSCULAR | Status: AC
  Filled 2023-11-15: qty 2

## 2023-11-15 MED ORDER — BUPIVACAINE-EPINEPHRINE (PF) 0.25% -1:200000 IJ SOLN
INTRAMUSCULAR | Status: DC | PRN
Start: 2023-11-15 — End: 2023-11-15
  Administered 2023-11-15: 10 mL
  Administered 2023-11-15: 20 mL

## 2023-11-15 MED ORDER — BUPIVACAINE LIPOSOME 1.3 % IJ SUSP
INTRAMUSCULAR | Status: AC
  Filled 2023-11-15: qty 20

## 2023-11-15 MED ORDER — PHENYLEPHRINE 80 MCG/ML (10ML) SYRINGE FOR IV PUSH (FOR BLOOD PRESSURE SUPPORT)
PREFILLED_SYRINGE | INTRAVENOUS | Status: DC | PRN
  Administered 2023-11-15: 160 ug via INTRAVENOUS
  Administered 2023-11-15 (×3): 80 ug via INTRAVENOUS

## 2023-11-15 MED ORDER — SODIUM CHLORIDE (PF) 0.9 % IJ SOLN
INTRAMUSCULAR | Status: AC
Start: 2023-11-15 — End: 2023-11-15
  Filled 2023-11-15: qty 20

## 2023-11-15 MED ORDER — FENTANYL CITRATE (PF) 100 MCG/2ML IJ SOLN
INTRAMUSCULAR | Status: AC
  Filled 2023-11-15: qty 2

## 2023-11-15 MED ORDER — CHLORHEXIDINE GLUCONATE 0.12 % MT SOLN
15.0000 mL | Freq: Once | OROMUCOSAL | Status: AC
  Administered 2023-11-15: 15 mL via OROMUCOSAL
  Filled 2023-11-15: qty 15

## 2023-11-15 MED ORDER — DIAZEPAM 5 MG PO TABS: 5.0000 mg | ORAL_TABLET | Freq: Four times a day (QID) | ORAL | 0 refills | Status: AC | PRN

## 2023-11-15 MED ORDER — SODIUM CHLORIDE 0.9 % IR SOLN
Status: DC | PRN
  Administered 2023-11-15: 20 mL

## 2023-11-15 MED ORDER — MIDAZOLAM HCL 2 MG/2ML IJ SOLN
INTRAMUSCULAR | Status: AC
  Filled 2023-11-15: qty 2

## 2023-11-15 MED ORDER — DEXAMETHASONE SODIUM PHOSPHATE 10 MG/ML IJ SOLN
INTRAMUSCULAR | Status: AC
  Filled 2023-11-15: qty 1

## 2023-11-15 MED ORDER — ALBUMIN HUMAN 5 % IV SOLN: INTRAVENOUS | Status: DC | PRN

## 2023-11-15 MED ORDER — EPHEDRINE 5 MG/ML INJ
INTRAVENOUS | Status: AC
  Filled 2023-11-15: qty 5

## 2023-11-15 MED ORDER — PROPOFOL 10 MG/ML IV BOLUS
INTRAVENOUS | Status: AC
  Filled 2023-11-15: qty 20

## 2023-11-15 MED ORDER — ORAL CARE MOUTH RINSE: 15.0000 mL | Freq: Once | OROMUCOSAL | Status: AC

## 2023-11-15 MED ORDER — LACTATED RINGERS IV SOLN: INTRAVENOUS | Status: DC

## 2023-11-15 MED ORDER — PROPOFOL 10 MG/ML IV BOLUS
INTRAVENOUS | Status: DC | PRN
  Administered 2023-11-15: 150 mg via INTRAVENOUS
  Administered 2023-11-15: 50 mg via INTRAVENOUS

## 2023-11-15 MED ORDER — VANCOMYCIN HCL 1000 MG IV SOLR
INTRAVENOUS | Status: AC
  Filled 2023-11-15: qty 20

## 2023-11-15 MED ORDER — LIDOCAINE 2% (20 MG/ML) 5 ML SYRINGE
INTRAMUSCULAR | Status: AC
  Filled 2023-11-15: qty 5

## 2023-11-15 MED ORDER — KETAMINE HCL 50 MG/5ML IJ SOSY
PREFILLED_SYRINGE | INTRAMUSCULAR | Status: DC | PRN
  Administered 2023-11-15: 20 mg via INTRAVENOUS
  Administered 2023-11-15: 30 mg via INTRAVENOUS

## 2023-11-15 SURGICAL SUPPLY — 75 items
BAG COUNTER SPONGE SURGICOUNT (BAG) ×1 IMPLANT
BASKET BONE COLLECTION (BASKET) ×1 IMPLANT
BENZOIN TINCTURE PRP APPL 2/3 (GAUZE/BANDAGES/DRESSINGS) IMPLANT
BLADE CLIPPER SURG (BLADE) IMPLANT
BUR MATCHSTICK NEURO 3.0 LAGG (BURR) ×1 IMPLANT
CAGE SABLE 10X22 6-12 8D (Cage) IMPLANT
CANISTER SUCTION 3000ML PPV (SUCTIONS) ×1 IMPLANT
CHLORAPREP W/TINT 26 (MISCELLANEOUS) IMPLANT
CNTNR URN SCR LID CUP LEK RST (MISCELLANEOUS) ×1 IMPLANT
COVER BACK TABLE 60X90IN (DRAPES) ×1 IMPLANT
DERMABOND ADVANCED .7 DNX12 (GAUZE/BANDAGES/DRESSINGS) ×1 IMPLANT
DEVICE DISSECT PLASMABLAD 3.0S (MISCELLANEOUS) ×1 IMPLANT
DRAPE C-ARM 42X72 X-RAY (DRAPES) ×2 IMPLANT
DRAPE C-ARMOR (DRAPES) ×1 IMPLANT
DRAPE HALF SHEET 40X57 (DRAPES) IMPLANT
DRAPE LAPAROTOMY 100X72X124 (DRAPES) ×1 IMPLANT
DRSG TEGADERM 4X4.75 (GAUZE/BANDAGES/DRESSINGS) IMPLANT
DURAPREP 26ML APPLICATOR (WOUND CARE) ×1 IMPLANT
DURASEAL APPLICATOR TIP (TIP) IMPLANT
DURASEAL SPINE SEALANT 3ML (MISCELLANEOUS) IMPLANT
ELECT NVM5 SURFACE MEP/EMG (ELECTRODE) IMPLANT
ELECTRODE BLDE 4.0 EZ CLN MEGD (MISCELLANEOUS) IMPLANT
ELECTRODE REM PT RTRN 9FT ADLT (ELECTROSURGICAL) ×1 IMPLANT
GAUZE 4X4 16PLY ~~LOC~~+RFID DBL (SPONGE) IMPLANT
GAUZE SPONGE 4X4 12PLY STRL (GAUZE/BANDAGES/DRESSINGS) ×1 IMPLANT
GLOVE BIOGEL PI IND STRL 8.5 (GLOVE) ×2 IMPLANT
GLOVE ECLIPSE 8.5 STRL (GLOVE) ×2 IMPLANT
GLOVE SURG SYN 8.0 PF PI (GLOVE) IMPLANT
GOWN STRL REUS W/ TWL LRG LVL3 (GOWN DISPOSABLE) IMPLANT
GOWN STRL REUS W/ TWL XL LVL3 (GOWN DISPOSABLE) IMPLANT
GOWN STRL REUS W/TWL 2XL LVL3 (GOWN DISPOSABLE) ×2 IMPLANT
HEMOSTAT POWDER KIT SURGIFOAM (HEMOSTASIS) ×1 IMPLANT
KIT BASIN OR (CUSTOM PROCEDURE TRAY) ×1 IMPLANT
KIT GRAFTMAG DEL NEURO DISP (NEUROSURGERY SUPPLIES) ×1 IMPLANT
KIT TURNOVER KIT B (KITS) ×1 IMPLANT
MILL BONE PREP (MISCELLANEOUS) ×1 IMPLANT
MODULE EMG NDL SSEP NVM5 (NEUROSURGERY SUPPLIES) IMPLANT
MODULE EMG NEEDLE SSEP NVM5 (NEUROSURGERY SUPPLIES) ×1 IMPLANT
NDL HYPO 22X1.5 SAFETY MO (MISCELLANEOUS) ×1 IMPLANT
NDL I-PASS III (NEEDLE) IMPLANT
NDL SPNL 22GX3.5 QUINCKE BK (NEEDLE) IMPLANT
NEEDLE HYPO 22X1.5 SAFETY MO (MISCELLANEOUS) IMPLANT
NEEDLE I-PASS III (NEEDLE) ×1 IMPLANT
NEEDLE SPNL 22GX3.5 QUINCKE BK (NEEDLE) ×1 IMPLANT
NS IRRIG 1000ML POUR BTL (IV SOLUTION) ×1 IMPLANT
PACK LAMINECTOMY NEURO (CUSTOM PROCEDURE TRAY) ×1 IMPLANT
PAD ARMBOARD POSITIONER FOAM (MISCELLANEOUS) ×3 IMPLANT
PATTIES SURGICAL .5 X1 (DISPOSABLE) ×1 IMPLANT
PROBE BALL TIP NVM5 SNG USE (NEUROSURGERY SUPPLIES) IMPLANT
PUTTY DBM INSTAFILL CART 5CC (Putty) IMPLANT
ROD RELINE MAS TI 5.5X35MM LRD (Rod) IMPLANT
ROD RELINE MAS TI LORD 5.5X40 (Rod) IMPLANT
SCREW LOCK RELINE 5.5 TULIP (Screw) IMPLANT
SCREW RED RELINE 7.5X45MM POLY (Screw) IMPLANT
SCREW RELINE RED 6.5X45MM POLY (Screw) IMPLANT
SCREW SHANK RELINE 6.5X45MM 2C (Screw) IMPLANT
SCREW SHANK RLINE MD 7.5X45 2C (Screw) IMPLANT
SPIKE FLUID TRANSFER (MISCELLANEOUS) ×1 IMPLANT
SPONGE SURGIFOAM ABS GEL 100 (HEMOSTASIS) ×1 IMPLANT
SPONGE T-LAP 4X18 ~~LOC~~+RFID (SPONGE) IMPLANT
STRIP CLOSURE SKIN 1/2X4 (GAUZE/BANDAGES/DRESSINGS) IMPLANT
SUT MNCRL AB 3-0 PS2 27 (SUTURE) IMPLANT
SUT MNCRL AB 4-0 PS2 18 (SUTURE) IMPLANT
SUT PROLENE 6 0 BV (SUTURE) IMPLANT
SUT VIC AB 1 CT1 18XBRD ANBCTR (SUTURE) ×1 IMPLANT
SUT VIC AB 2-0 CP2 18 (SUTURE) ×1 IMPLANT
SUT VIC AB 3-0 SH 8-18 (SUTURE) ×1 IMPLANT
SUT VIC AB 4-0 RB1 18 (SUTURE) ×1 IMPLANT
SYR 3ML LL SCALE MARK (SYRINGE) ×4 IMPLANT
TIP CONICAL INSTAFILL (ORTHOPEDIC DISPOSABLE SUPPLIES) IMPLANT
TOWEL GREEN STERILE (TOWEL DISPOSABLE) ×1 IMPLANT
TOWEL GREEN STERILE FF (TOWEL DISPOSABLE) ×1 IMPLANT
TRAY FOLEY MTR SLVR 16FR STAT (SET/KITS/TRAYS/PACK) ×1 IMPLANT
TUBE FUNNEL GL DISP (ORTHOPEDIC DISPOSABLE SUPPLIES) IMPLANT
WATER STERILE IRR 1000ML POUR (IV SOLUTION) ×1 IMPLANT

## 2023-11-15 NOTE — H&P (Signed)
 Orthopaedic Service H&P/Consult     Patient ID: Kendra Dodson MRN: [id number] DOB/AGE: [birth year] 63 y.o.  Chief Complaint: Low back and bilateral leg pain HPI: Kendra Dodson is an 63 y.o. female.  With a history of severe low back and bilateral leg pain felt to relate to an isthmic spondylolisthesis at the L5-S1 level with significant canal narrowing.  She has been through extensive conservative care and presents today for surgical fixation  Past Medical History:  Diagnosis Date   Asthma    Fibromyalgia    HLD (hyperlipidemia)    Hypertension    Hypothyroid    Migraines    PTSD (post-traumatic stress disorder)     Past Surgical History:  Procedure Laterality Date   ADENOIDECTOMY     CHOLECYSTECTOMY     knee arthoplasty Bilateral    vaginal mesh      Family History  Problem Relation Age of Onset   Hypertension Mother    Hypertension Father    Diabetes Father    Social History:  reports that she has never smoked. She has never used smokeless tobacco. She reports that she does not currently use alcohol. She reports that she does not use drugs.  Allergies:  Allergies  Allergen Reactions   Prednisone  Other (See Comments)    Oral Prednisone  Other Reaction(s): Psychosis Lose sleep and become manic    Bupropion Other (See Comments)    Wellbutrin    Medications Prior to Admission  Medication Sig Dispense Refill   acetaminophen  (TYLENOL ) 500 MG tablet Take 1,000 mg by mouth every 6 (six) hours as needed for mild pain (pain score 1-3) or moderate pain (pain score 4-6).     atorvastatin  (LIPITOR) 40 MG tablet Take 1 tablet (40 mg total) by mouth daily. 90 tablet 3   busPIRone (BUSPAR) 15 MG tablet Take 15 mg by mouth 2 (two) times daily.     carboxymethylcellulose 1 % ophthalmic solution Place 1 drop into both eyes daily as needed (Dry eye). Refresh Tears     cyclobenzaprine  (FLEXERIL ) 10 MG tablet Take 10 mg by mouth at bedtime as needed for muscle spasms (muscle spasms).      levothyroxine  (SYNTHROID ) 75 MCG tablet Take 1 tablet (75 mcg total) by mouth daily. (Patient taking differently: Take 100 mcg by mouth daily.) 90 tablet 1   lisinopril  (ZESTRIL ) 5 MG tablet Take 1 tablet (5 mg total) by mouth daily. 30 tablet 11   loratadine (CLARITIN) 10 MG tablet Take 10 mg by mouth daily.     LUNESTA 2 MG TABS tablet Take 2 mg by mouth at bedtime.     Melatonin 10 MG TABS Take 10 mg by mouth at bedtime.     NURTEC 75 MG TBDP Take 75 mg by mouth daily as needed (Migraine).     phenylephrine  (NEO-SYNEPHRINE) 0.25 % nasal spray Place 1 spray into both nostrils every 6 (six) hours as needed for congestion.     prazosin (MINIPRESS) 5 MG capsule Take 5 mg by mouth at bedtime.     rizatriptan (MAXALT) 10 MG tablet Take 10 mg by mouth as needed for migraine.     venlafaxine XR (EFFEXOR-XR) 150 MG 24 hr capsule Take 150 mg by mouth daily.      Results for orders placed or performed during the hospital encounter of 11/15/23 (from the past 48 hours)  ABO/Rh     Status: None   Collection Time: 11/15/23 11:00 AM  Result Value Ref Range   ABO/RH(D)  O NEG Performed at [facility], [address] [place], [place] [address]    No results found.  ROS  Blood pressure 136/89, pulse 62, temperature 98.2 F (36.8 C), temperature source Oral, resp. rate 18, height 5' 4 (1.626 m), weight 79.4 kg, SpO2 97%. Physical Exam Well-appearing woman no acute distress  Head is normocephalic atraumatic  Alert and orient x 3  Regular rate and rhythm  No obvious breathing issues  Abdomen soft and supple  Intact sensation in the L3-S1 dermatomes  5/5 strength in the bilateral EHL, FHL, gastrocs, tibialis anterior, quadriceps, hamstrings  Assessment/Plan  Spondylolisthesis with radiculopathy  Kendra Dodson presents today for surgical fixation of her spondylolisthesis.  This would be a transforaminal lumbar and right fusion at the L5-S1 level.     [name], MD,  MS [name] [phone]

## 2023-11-15 NOTE — Discharge Instructions (Addendum)
 DISCHARGE INSTRUCTIONS LUMBAR FUSION      INCISION   Please make sure your incisions are checked at least twice daily for signs and symptoms of infection: If any of the below should occur, please call the office. Drainage from incisional site Opening of incisions Fevers greater than 101.3 Flu-like symptoms Increased redness and/or tenderness  If you have staples or sutures (not tape) in your incision they may be removed 2 weeks following your surgery.  This may be done by a visiting nurse, family physician or by making an appointment to come into the office.   SHOWERING You may shower as normal once the large, bulky bandages are removed from your incisions.  If they are not removed before your discharge from the hospital, you may remove them 2 days after your surgery.         Hair washing is permissible while in the shower.  No tub baths, hot tubs or whirlpools until you are seen in the office.    EXERCISE   Lift objects weighing less than 10-15 lbs      Do not bend or twist at the waist.  Always bend your knees!!      Limit your sitting to 20-30 minute intervals.  You should lie down or walk in between sitting periods.          There are no limitations for sitting in a recliner chair.      Walk as much as possible-let discomfort be your guide.  You may also go up and down stairs as much as you can tolerate.        Walking outside (as long as it is nice weather) or walking on a treadmill is permitted (no incline).  BONE STIMULATOR A bone stimulator is a brace that emits an electrical signal to help with bone fusion.  If this was recommended to you, a representative will contact you once you are home to arrange delivery.  This should be worn as instructed.  It is usually used for 3 months after surgery.  PAIN   Take pain medication as prescribed. As your pain level decreases, you may begin to take over-the-counter Extra Strength Tylenol .     DO NOT take any anti-inflammatories (like  Motrin, Advil, etc.) for 10 weeks after surgery.       Taking anti-inflammatory medications can interfere with fusion healing.      Do not resume taking Fosamax or other osteoporosis medications for 12 weeks after your fusion surgery.  DRIVING  You may not drive a car until told otherwise by your physician.   You may be a passenger for short distances (20-30 minutes).  If you must take a longer trip, make sure to make several stops so that you can walk around and stretch your legs.  Reclining the passenger seat seems to be the most comfortable position for most patients.         FOLLOW-UP APPOINTMENTS An appointment should have been made for you already.  If not, or if you have any other questions, please.   [phone]  [name], MD, MS [name] [phone]

## 2023-11-15 NOTE — Discharge Summary (Signed)
 Physician Discharge Summary  Patient ID: Jackelyne Sayer MRN: [id number] DOB/AGE: Jun 03, 1960 63 y.o.  Admit date: 11/15/2023 Discharge date: 11/15/2023  Admission Diagnoses: Spondylolisthesis, radiculopathy  Discharge Diagnoses:  Spondylolisthesis, radiculopathy  Discharged Condition: good  Hospital Course: Patient was admitted to the hospital on 11/15/2023 and taken to the operating room on the same day.  She was underwent a fusion at the L5-S1 level.  Please see the operative note for more details.  She transferred to  the recovery room and was able to the discharged the same day.  Consults: None    Treatments: Transforaminal lumbar Tri fusion L5-S1  Discharge Exam: Blood pressure 136/89, pulse 62, temperature 98.2 F (36.8 C), temperature source Oral, resp. rate 18, height 5' 4 (1.626 m), weight 79.4 kg, SpO2 97%. Neurologic: Alert and oriented X 3, normal strength and tone. Normal symmetric reflexes. Normal coordination and gait  Disposition: home      Signed: Cordella SHAUNNA Rhein 11/15/2023, 12:15 PM

## 2023-11-15 NOTE — Op Note (Signed)
 PATIENT NAME: Kendra Dodson   MEDICAL RECORD NO.:   [id number]   DATE OF BIRTH: DOB@    DATE OF PROCEDURE: TD@                                OPERATIVE REPORT     PREOPERATIVE DIAGNOSES: 1.  Spondylolisthesis L5-S1 2.  Radiculopathy  POSTOPERATIVE DIAGNOSES: 1.  Same   PROCEDURES: 1.  Left-sided L5-S1 transforaminal lumbar interbody fusion with posterior fusion. 2. Insertion of interbody device x1 . 3. Placement of segmental posterior instrumentation L5 & S1 bilaterally  4. Use of local autograft. 5. Use of morselized allograft  6. Intraoperative use of fluoroscopy.   SURGEON:  [name], MD.   ASSISTANT: None   ANESTHESIA:  General endotracheal anesthesia.   COMPLICATIONS:  None.   DISPOSITION:  Stable.   ESTIMATED BLOOD LOSS: 50   INDICATIONS FOR SURGERY:  Briefly,  [name] is a pleasant 63 -year-old woman who did present to me with severe and ongoing pain back and in the bilateral leg. I did feel that the symptoms were secondary to the findings noted above.   The patient failed conservative care and did wish to proceed with the procedure  noted above.  Prior to surgery the risks benefits alternatives reviewed the patient.  Risks including but limited to bleeding, infection, injury to nerves, injury to the dura, CSF leak, iatrogenic instability, postoperative symptoms, and need for general surgery.  Department was obtained.  The back was marked prior to the surgery   OPERATIVE DETAILS:  The patient was brought to surgery and general endotracheal anesthesia was administered.  The patient was placed prone on a well-padded flat Jackson bed with a spinal frame.  Antibiotics were given and a time-out procedure was performed. The back was prepped and draped in the usual fashion.  The skin was anesthetized with 0.25% marcaine  with epi.  The pedicles were identified on AP imaging.  We marked incisions just lateral to the L5 and S1 pedicles.  2 incisions were made with a  10 blade.  Electrocautery was then used dissect down to the fascia.  We then performed a subfascial block using combination of Exparel  and Marcaine .  We also injected this into the subcutaneous tissues.  We then incised the fascia.  We used a Cobb to remove soft tissues off the facet joint at L5-S1 bilaterally as well as of the transverse processes and sacral ala.  Jamshidi's were advanced through the pedicles first on AP imaging.  This was continued until it touched the medial border of the pedicle.  We then confirmed on lateral views that this advanced into vertebral body.  Guidewires were then placed.  We placed screws.  These were globus realigned screws 6.5 x 45 mm at L5 and 7.5 x 45 m at S1.  On the left side, blades for retractor were kept over the screws.  The retractor was fixed into place over the screws.  We swept the muscles off the remaining lamina and facet joints and placed a medial retractor.  Electrocautery pituitary remove additional soft tissue off of the remaining portion of the lamina identifying the pars and facet joint.  A bur was then used to perform a hemilaminotomy removing the inferior lamina on the left side the pars joint as well as the inferior and superior portions of the facet joint at the L5-S1 segment.  We used a curette dissect under the  ligament structures was removed with Kerrison punches.  The transversing nerve root was identified.  We are then able to identify the disc space.  Of note significant step-off from the spinal thesis was noted.  We retracted the thecal sac medially.  We confirmed our position and allow fluoroscopic imaging.  The disc base was incised with a 15 blade.  A starting dilator was first placed.  We distracted through the disc space and then distracted on the screw based distractor.  We then used sequential dilators distracting with the screws after each dilation.  We then used curettes to pituitary rongeurs to remove all disc material.   A rasp was then  used.  We placed the globus Cupertino cage.  This was expanded till adequate compression was felt.  This as significant toward the disc height.  Prior to placing the cage, demised bone matrix and local bone graft was placed into the disc base.  Additional bone graft was then backfilled into the cage.  The wound was copiously irrigated.  No bleeding was noted from the field.  No CSF was noted.  I will stop maneuvers performed.  We removed the distractor from the screws and then placed the towers.  Rods were then fixed.  This was a 45 mm rod on the right side and 40 mm on the left.  Locking caps were applied.  These were then final tightened.  The towers were removed.  Final fluoroscopic images demonstrate some position of a TLIF at the L5-S1 segment.  The wound was copiously irrigated.  The fascia was closed with #1 Vicryl.  2-0 Vicryl was used for the deep dermal layers.  The skin was closed with 4-0 Monocryl.  The wound was then washed and dressed with benzoin Steri-Strips 4 x 4's and Tegaderm.  The patient was woken from anesthesia and transferred to the recovery in stable condition.     [name], MD, MS [name] [phone]

## 2023-11-15 NOTE — Anesthesia Preprocedure Evaluation (Addendum)
 Anesthesia Evaluation  Patient identified by MRN, date of birth, ID band Patient awake    Reviewed: Allergy & Precautions, NPO status , Patient's Chart, lab work & pertinent test results  History of Anesthesia Complications Negative for: history of anesthetic complications  Airway Mallampati: I  TM Distance: >3 FB Neck ROM: Full    Dental  (+) Dental Advisory Given, Teeth Intact   Pulmonary asthma    Pulmonary exam normal        Cardiovascular hypertension, Pt. on medications Normal cardiovascular exam     Neuro/Psych  Headaches PSYCHIATRIC DISORDERS Anxiety      Neuromuscular disease    GI/Hepatic negative GI ROS, Neg liver ROS,,,  Endo/Other  Hypothyroidism   Obesity   Renal/GU negative Renal ROS     Musculoskeletal  (+)  Fibromyalgia -  Abdominal   Peds  Hematology negative hematology ROS (+)   Anesthesia Other Findings   Reproductive/Obstetrics                              Anesthesia Physical Anesthesia Plan  ASA: 2  Anesthesia Plan: General   Post-op Pain Management: Tylenol  PO (pre-op)*, Celebrex  PO (pre-op)* and Ketamine  IV*   Induction: Intravenous  PONV Risk Score and Plan: 3 and Treatment may vary due to age or medical condition, Ondansetron , Midazolam  and Propofol  infusion  Airway Management Planned: Oral ETT  Additional Equipment: None  Intra-op Plan:   Post-operative Plan: Extubation in OR  Informed Consent: I have reviewed the patients History and Physical, chart, labs and discussed the procedure including the risks, benefits and alternatives for the proposed anesthesia with the patient or authorized representative who has indicated his/her understanding and acceptance.     Dental advisory given  Plan Discussed with: CRNA and Anesthesiologist  Anesthesia Plan Comments:          Anesthesia Quick Evaluation

## 2023-11-15 NOTE — Anesthesia Procedure Notes (Addendum)
 Procedure Name: Intubation Date/Time: 11/15/2023 1:32 PM  Performed by: Jolynn Mage, CRNAPre-anesthesia Checklist: Patient identified, Patient being monitored, Timeout performed, Emergency Drugs available and Suction available Patient Re-evaluated:Patient Re-evaluated prior to induction Oxygen Delivery Method: Circle System Utilized Preoxygenation: Pre-oxygenation with 100% oxygen Induction Type: IV induction Ventilation: Mask ventilation without difficulty Laryngoscope Size: Miller and 2 Grade View: Grade I Tube type: Oral Tube size: 7.0 mm Number of attempts: 1 Airway Equipment and Method: Stylet Placement Confirmation: ETT inserted through vocal cords under direct vision, positive ETCO2 and breath sounds checked- equal and bilateral Secured at: 21 cm Tube secured with: Tape Dental Injury: Teeth and Oropharynx as per pre-operative assessment

## 2023-11-15 NOTE — Transfer of Care (Signed)
 Immediate Anesthesia Transfer of Care Note  Patient: Kendra Dodson  Procedure(s) Performed: TRANSFORAMINAL LUMBAR INTERBODY FUSION (TLIF) WITH PEDICLE SCREW FIXATION 1 LEVEL  Patient Location: PACU  Anesthesia Type:General  Level of Consciousness: awake, alert , and oriented  Airway & Oxygen Therapy: Patient Spontanous Breathing and Patient connected to face mask oxygen  Post-op Assessment: Report given to RN and Post -op Vital signs reviewed and stable  Post vital signs: Reviewed and stable  Last Vitals:  Vitals Value Taken Time  BP 141/78 11/15/23 15:45  Temp 36.9 C 11/15/23 15:39  Pulse 84 11/15/23 15:48  Resp 11 11/15/23 15:48  SpO2 99 % 11/15/23 15:48  Vitals shown include unfiled device data.  Last Pain:  Vitals:   11/15/23 1036  TempSrc: Oral  PainSc:       Patients Stated Pain Goal: 2 (11/15/23 1031)  Complications: No notable events documented.

## 2023-11-16 NOTE — Anesthesia Postprocedure Evaluation (Signed)
 Anesthesia Post Note  Patient: Kendra Dodson  Procedure(s) Performed: TRANSFORAMINAL LUMBAR INTERBODY FUSION (TLIF) WITH PEDICLE SCREW FIXATION 1 LEVEL     Patient location during evaluation: PACU Anesthesia Type: General Level of consciousness: awake and alert Pain management: pain level controlled Vital Signs Assessment: post-procedure vital signs reviewed and stable Respiratory status: spontaneous breathing, nonlabored ventilation and respiratory function stable Cardiovascular status: stable and blood pressure returned to baseline Anesthetic complications: no   No notable events documented.  Last Vitals:  Vitals:   11/15/23 1615 11/15/23 1630  BP: (!) 150/86 (!) 153/89  Pulse: 67 77  Resp: 12 15  Temp:  36.6 C  SpO2: 99% 99%                    Debby FORBES Like

## 2023-11-18 ENCOUNTER — Other Ambulatory Visit: Payer: Self-pay

## 2023-11-18 MED FILL — Thrombin For Soln 5000 Unit: CUTANEOUS | Qty: 5000 | Status: AC

## 2023-11-19 ENCOUNTER — Encounter (HOSPITAL_COMMUNITY): Payer: Self-pay

## 2023-12-02 ENCOUNTER — Other Ambulatory Visit: Payer: Self-pay

## 2023-12-02 ENCOUNTER — Ambulatory Visit: Attending: Orthopedic Surgery

## 2023-12-02 DIAGNOSIS — M5459 Other low back pain: Secondary | ICD-10-CM | POA: Diagnosis present

## 2023-12-02 DIAGNOSIS — M4326 Fusion of spine, lumbar region: Secondary | ICD-10-CM | POA: Diagnosis present

## 2023-12-02 DIAGNOSIS — M6281 Muscle weakness (generalized): Secondary | ICD-10-CM | POA: Insufficient documentation

## 2023-12-02 NOTE — Therapy (Signed)
 OUTPATIENT PHYSICAL THERAPY THORACOLUMBAR EVALUATION   Patient Name: Kendra Dodson MRN: [id number] DOB:October 25, 1960, 63 y.o., female Today's Date: 12/02/2023  END OF SESSION:  PT End of Session - 12/02/23 1141     Visit Number 1    Number of Visits 8    Date for Recertification  02/01/24    Authorization Type [employer]    PT Start Time 1130    PT Stop Time 1215    PT Time Calculation (min) 45 min    Activity Tolerance Patient tolerated treatment well;Patient limited by pain    Behavior During Therapy WFL for tasks assessed/performed          Past Medical History:  Diagnosis Date   Asthma    Fibromyalgia    HLD (hyperlipidemia)    Hypertension    Hypothyroid    Migraines    PTSD (post-traumatic stress disorder)    Past Surgical History:  Procedure Laterality Date   ADENOIDECTOMY     CHOLECYSTECTOMY     knee arthoplasty Bilateral    TRANSFORAMINAL LUMBAR INTERBODY FUSION (TLIF) WITH PEDICLE SCREW FIXATION 1 LEVEL N/A 11/15/2023   Procedure: TRANSFORAMINAL LUMBAR INTERBODY FUSION (TLIF) WITH PEDICLE SCREW FIXATION 1 LEVEL;  Surgeon: Reyne Cordella SQUIBB, MD;  Location: [facility] OR;  Service: Orthopedics;  Laterality: N/A;   vaginal mesh     Patient Active Problem List   Diagnosis Date Noted   Hypothyroidism 10/26/2019    PCP: Shannan Christain FERNS, NP   REFERRING PROVIDER: Reyne Cordella SQUIBB, MD  REFERRING DIAG: TRANSFORAMINAL LUMBAR INTERBODY FUSION DONE AT LS - SI LEVEL  Rationale for Evaluation and Treatment: Rehabilitation  THERAPY DIAG:  Other low back pain  Muscle weakness (generalized)  Fusion of spine of lumbar region  ONSET DATE: DOS 11/15/23  SUBJECTIVE:                                                                                                                                                                                           SUBJECTIVE STATEMENT: 7/10 level of discomfort in lumbo sacral region, denies radiating symptoms.  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 Yes: NPRS scale: 7/10 Pain location: lumbosacral region Pain description: ache, throb Aggravating factors: activity and position changes Relieving factors: position changes and rest, heat, aquatics  PRECAUTIONS: Other: fusion precautions  RED FLAGS: None   WEIGHT BEARING RESTRICTIONS: No  FALLS:  Has patient fallen in last 6 months? No  OCCUPATION: not working currently  PLOF: Independent  PATIENT GOALS: To manage  my back pain  NEXT MD VISIT: 4 weeks  OBJECTIVE:  Note: Objective measures were completed at Evaluation unless otherwise noted.  DIAGNOSTIC FINDINGS:  Two fluoroscopic spot views of the lumbar spine submitted from the operating room. Posterior rod with pedicle screw fixation and interbody spacer at L5-S1. Fluoroscopy time 1 minutes 26 seconds. Dose 66.04 mGy.   IMPRESSION: Intraoperative fluoroscopy during lumbar fusion.     Electronically Signed   By: Andrea Gasman M.D.   On: 11/15/2023 15:51  PATIENT SURVEYS:  Modified Oswestry: 35/50  Interpretation of scores: Score Category Description  0-20% Minimal Disability The patient can cope with most living activities. Usually no treatment is indicated apart from advice on lifting, sitting and exercise  21-40% Moderate Disability The patient experiences more pain and difficulty with sitting, lifting and standing. Travel and social life are more difficult and they may be disabled from work. Personal care, sexual activity and sleeping are not grossly affected, and the patient can usually be managed by conservative means  41-60% Severe Disability Pain remains the main problem in this group, but activities of daily living are affected. These patients require a detailed investigation  61-80% Crippled Back pain impinges on all aspects of the patient's life.  Positive intervention is required  81-100% Bed-bound  These patients are either bed-bound or exaggerating their symptoms  [name], et al. Surgery versus conservative management of stable thoracolumbar fracture: the PRESTO feasibility RCT. [place]): [employer]; 2021 Nov. [facility] Technology Assessment, No. 25.62.) Appendix 3, Oswestry Disability Index category descriptors. Available from: FindJewelers.cz  Minimally Clinically Important Difference (MCID) = 12.8%   MUSCLE LENGTH: Hamstrings: Right 90 deg; Left 90 deg  POSTURE: No Significant postural limitations  PALPATION: deferred  LUMBAR ROM: deferred post-op  AROM eval  Flexion   Extension   Right lateral flexion   Left lateral flexion   Right rotation 50%  Left rotation 50%   (Blank rows = not tested)  LOWER EXTREMITY ROM:     Active  Right eval Left eval  Hip flexion    Hip extension    Hip abduction    Hip adduction    Hip internal rotation    Hip external rotation    Knee flexion    Knee extension    Ankle dorsiflexion    Ankle plantarflexion    Ankle inversion    Ankle eversion     (Blank rows = not tested)  LOWER EXTREMITY MMT:    MMT Right eval Left eval  Hip flexion    Hip extension    Hip abduction    Hip adduction    Hip internal rotation    Hip external rotation    Knee flexion    Knee extension    Ankle dorsiflexion    Ankle plantarflexion    Ankle inversion    Ankle eversion     (Blank rows = not tested)  LUMBAR SPECIAL TESTS:  Straight leg raise test: Negative and Slump test: Negative  FUNCTIONAL TESTS:  30 seconds chair stand test 1  GAIT: Distance walked: 59ft x2 Assistive device utilized: Single point cane Level of assistance: Complete Independence Comments: slow cadence  TREATMENT:  [facility] Adult PT Treatment:                                                DATE: 12/02/23 Eval and HEP Self Care:  Additional minutes spent for educating on updated Therapeutic Home Exercise Program as well as comparing current status to condition at start of symptoms. This included exercises focusing on stretching, strengthening, with focus on eccentric aspects. Long term goals include an improvement in range of motion, strength, endurance as well as avoiding reinjury. Patient's frequency would include in 1-2 times a day, 3-5 times a week for a duration of 6-12 weeks. Proper technique shown and discussed handout in great detail. All questions were discussed and addressed.       PATIENT EDUCATION:  Education details: Discussed eval findings, rehab rationale and POC and patient is in agreement  Person educated: Patient Education method: Explanation and Handouts Education comprehension: verbalized understanding and needs further education  HOME EXERCISE PROGRAM: Access Code: [email or web address] URL: [email or web address] Date: 12/02/2023 Prepared by: Reyes Kohut  Exercises - Supine Posterior Pelvic Tilt  - 2-3 x daily - 5 x weekly - 1 sets - 10 reps - 3s hold - Supine March with Posterior Pelvic Tilt  - 2-3 x daily - 5 x weekly - 1 sets - 10 reps - Clamshell  - 2-3 x daily - 5 x weekly - 1 sets - 10-15 reps  ASSESSMENT:  CLINICAL IMPRESSION: Patient is a 63 y.o. female who was seen today for physical therapy evaluation and treatment for low back pain following L5-S1 fusion. Patient reports varying levels of discomfort but no true radicular symptoms.  BLE ROM and flexibility are good, no issues with neural tensioning but 30s chair stand test finds LE weakness and poor body mechanics.  Symptoms aggravated by prolonged positioning and activity.  Overall intensity of discomfort resolving over time.  Patient is a good rehab candidate with treatment focus on maintaining flexibility, building  LE and core strength and functional training to improve mobility.  OBJECTIVE IMPAIRMENTS: Abnormal gait, decreased activity tolerance, decreased endurance, decreased knowledge of condition, decreased mobility, difficulty walking, decreased ROM, decreased strength, postural dysfunction, and pain.   ACTIVITY LIMITATIONS: carrying, lifting, bending, sitting, standing, squatting, stairs, and bed mobility  PERSONAL FACTORS: Age, Fitness, Past/current experiences, and Time since onset of injury/illness/exacerbation are also affecting patient's functional outcome.   REHAB POTENTIAL: Good  CLINICAL DECISION MAKING: Evolving/moderate complexity  EVALUATION COMPLEXITY: Low   GOALS: Goals reviewed with patient? No  SHORT TERM GOALS: Target date: 12/30/2023  Patient to demonstrate independence in HEP  Baseline: Access Code: [email or web address] Goal status: INITIAL  2.  Assess 2 MWT Baseline: TBD Goal status: INITIAL   LONG TERM GOALS: Target date: 01/27/2024  Patient will acknowledge 4/10 pain at least once during episode of care   Baseline: 7/10 Goal status: INITIAL  2.  Patient will score at least 25/50 on ODI to signify clinically meaningful improvement in functional abilities.   Baseline: 35/50 Goal status: INITIAL  3.  Patient will increase 30s chair stand reps from 1 to 5 with/without arms to demonstrate and improved functional ability with less pain/difficulty as well as reduce fall risk.  Baseline: 1 Goal status: INITIAL  4.  Assess 2 MWT for progress Baseline: TBD Goal status: INITIAL    PLAN:  PT FREQUENCY: 1x/week  PT DURATION: 8 weeks  PLANNED INTERVENTIONS: 97110-Therapeutic exercises, 97530- Therapeutic activity, [id number]- Neuromuscular re-education, 97535- Self Care, [id number]- Manual therapy, [phone]- Gait training, and Patient/Family education.  PLAN FOR NEXT SESSION: HEP review and update, manual techniques as appropriate, aerobic tasks, ROM and flexibility activities,  strengthening and PREs, TPDN, gait and balance training as needed   For all possible CPT codes, reference the Planned Interventions line above.     Check all conditions that are expected to impact treatment: {Conditions expected to impact treatment:Structural or anatomic abnormalities and Complications related to surgery   If treatment provided at initial evaluation, no treatment charged due to lack of authorization.       Ken Bonn M Moni Rothrock, PT 12/02/2023, 12:30 PM

## 2023-12-04 ENCOUNTER — Ambulatory Visit

## 2023-12-11 ENCOUNTER — Ambulatory Visit

## 2023-12-11 DIAGNOSIS — M6281 Muscle weakness (generalized): Secondary | ICD-10-CM | POA: Diagnosis present

## 2023-12-11 DIAGNOSIS — M25561 Pain in right knee: Secondary | ICD-10-CM | POA: Diagnosis present

## 2023-12-11 DIAGNOSIS — M25562 Pain in left knee: Secondary | ICD-10-CM | POA: Diagnosis present

## 2023-12-11 DIAGNOSIS — M5459 Other low back pain: Secondary | ICD-10-CM | POA: Insufficient documentation

## 2023-12-11 DIAGNOSIS — M4326 Fusion of spine, lumbar region: Secondary | ICD-10-CM | POA: Insufficient documentation

## 2023-12-11 DIAGNOSIS — G8929 Other chronic pain: Secondary | ICD-10-CM | POA: Diagnosis present

## 2023-12-11 NOTE — Therapy (Signed)
 OUTPATIENT PHYSICAL THERAPY TREATMENT   Patient Name: Kendra Dodson MRN: [id number] DOB:08-Jun-1960, 63 y.o., female Today's Date: 12/11/2023  END OF SESSION:  PT End of Session - 12/11/23 1138     Visit Number 2    Number of Visits 8    Date for Recertification  02/01/24    Authorization Type [employer]    PT Start Time 1140    PT Stop Time 1220    PT Time Calculation (min) 40 min    Activity Tolerance Patient tolerated treatment well;Patient limited by pain    Behavior During Therapy WFL for tasks assessed/performed           Past Medical History:  Diagnosis Date   Asthma    Fibromyalgia    HLD (hyperlipidemia)    Hypertension    Hypothyroid    Migraines    PTSD (post-traumatic stress disorder)    Past Surgical History:  Procedure Laterality Date   ADENOIDECTOMY     CHOLECYSTECTOMY     knee arthoplasty Bilateral    TRANSFORAMINAL LUMBAR INTERBODY FUSION (TLIF) WITH PEDICLE SCREW FIXATION 1 LEVEL N/A 11/15/2023   Procedure: TRANSFORAMINAL LUMBAR INTERBODY FUSION (TLIF) WITH PEDICLE SCREW FIXATION 1 LEVEL;  Surgeon: Reyne Cordella SQUIBB, MD;  Location: [facility] OR;  Service: Orthopedics;  Laterality: N/A;   vaginal mesh     Patient Active Problem List   Diagnosis Date Noted   Hypothyroidism 10/26/2019    PCP: Shannan Christain FERNS, NP   REFERRING PROVIDER: Reyne Cordella SQUIBB, MD  REFERRING DIAG: TRANSFORAMINAL LUMBAR INTERBODY FUSION DONE AT LS - SI LEVEL  Rationale for Evaluation and Treatment: Rehabilitation  THERAPY DIAG:  Other low back pain  Muscle weakness (generalized)  ONSET DATE: DOS 11/15/23  SUBJECTIVE:                                                                                                                                                                                           SUBJECTIVE STATEMENT: Pt presents to PT with reports of decreased pain compared to eval, roughly 4/10 today. Pt has been compliant with HEP.   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  Yes: NPRS scale: 7/10 Pain location: lumbosacral region Pain description: ache, throb Aggravating factors: activity and position changes Relieving factors: position changes and rest, heat, aquatics  PRECAUTIONS: Other: fusion precautions  RED FLAGS: None   WEIGHT BEARING RESTRICTIONS: No  FALLS:  Has patient fallen in last 6 months? No  OCCUPATION: not working currently  PLOF: Independent  PATIENT GOALS: To manage my back pain  NEXT MD VISIT: 4 weeks  OBJECTIVE:  Note: Objective measures were completed at Evaluation unless otherwise noted.  DIAGNOSTIC FINDINGS:  Two fluoroscopic spot views of the lumbar spine submitted from the operating room. Posterior rod with pedicle screw fixation and interbody spacer at L5-S1. Fluoroscopy time 1 minutes 26 seconds. Dose 66.04 mGy.   IMPRESSION: Intraoperative fluoroscopy during lumbar fusion.     Electronically Signed   By: Andrea Gasman M.D.   On: 11/15/2023 15:51  PATIENT SURVEYS:  Modified Oswestry: 35/50  Interpretation of scores: Score Category Description  0-20% Minimal Disability The patient can cope with most living activities. Usually no treatment is indicated apart from advice on lifting, sitting and exercise  21-40% Moderate Disability The patient experiences more pain and difficulty with sitting, lifting and standing. Travel and social life are more difficult and they may be disabled from work. Personal care, sexual activity and sleeping are not grossly affected, and the patient can usually be managed by conservative means  41-60% Severe Disability Pain remains the main problem in this group, but activities of daily living are affected. These patients require a detailed investigation  61-80% Crippled Back pain impinges on all aspects of the patient's life.  Positive intervention is required  81-100% Bed-bound  These patients are either bed-bound or exaggerating their symptoms  [name], et al. Surgery versus conservative management of stable thoracolumbar fracture: the PRESTO feasibility RCT. [place] ([place]): [employer]; 2021 Nov. [facility] Technology Assessment, No. 25.62.) Appendix 3, Oswestry Disability Index category descriptors. Available from: FindJewelers.cz  Minimally Clinically Important Difference (MCID) = 12.8%   MUSCLE LENGTH: Hamstrings: Right 90 deg; Left 90 deg  POSTURE: No Significant postural limitations  PALPATION: deferred  LUMBAR ROM: deferred post-op  AROM eval  Flexion   Extension   Right lateral flexion   Left lateral flexion   Right rotation 50%  Left rotation 50%   (Blank rows = not tested)  LOWER EXTREMITY ROM:     Active  Right eval Left eval  Hip flexion    Hip extension    Hip abduction    Hip adduction    Hip internal rotation    Hip external rotation    Knee flexion    Knee extension    Ankle dorsiflexion    Ankle plantarflexion    Ankle inversion    Ankle eversion     (Blank rows = not tested)  LOWER EXTREMITY MMT:    MMT Right eval Left eval  Hip flexion    Hip extension    Hip abduction    Hip adduction    Hip internal rotation    Hip external rotation    Knee flexion    Knee extension    Ankle dorsiflexion    Ankle plantarflexion    Ankle inversion    Ankle eversion     (Blank rows = not tested)  LUMBAR SPECIAL TESTS:  Straight leg raise test: Negative and Slump test: Negative  FUNCTIONAL TESTS:  30 seconds chair stand test 1  GAIT: Distance walked: 74ft x2 Assistive device utilized: Single point cane Level of assistance: Complete Independence Comments: slow cadence  TREATMENT: [facility] Adult PT Treatment:  DATE: 12/11/2023 NuStep lvl 5 UE/LE x 4 min while  taking subjective Supine PPT x 10 - 5 hold Supine PPT ball 2x10 - 5 hold Hooklying clamshell 2x15 blue band STS 2x10 no UE Standing hip abd 2x10 ea  PATIENT EDUCATION:  Education details: Discussed eval findings, rehab rationale and POC and patient is in agreement  Person educated: Patient Education method: Explanation and Handouts Education comprehension: verbalized understanding and needs further education  HOME EXERCISE PROGRAM: Access Code: [email or web address] URL: [email or web address] Date: 12/11/2023 Prepared by: Alm Kingdom  Exercises - Supine Posterior Pelvic Tilt  - 2-3 x daily - 5 x weekly - 1 sets - 10 reps - 3s hold - Supine March with Posterior Pelvic Tilt  - 2-3 x daily - 5 x weekly - 1 sets - 10 reps - Clamshell  - 2-3 x daily - 5 x weekly - 1 sets - 10-15 reps - Hooklying Clamshell with Resistance  - 1 x daily - 7 x weekly - 3 sets - 10 reps - blue band hold - Posterior Pelvic Tilt with Adduction Using Ball in Supine  - 1 x daily - 7 x weekly - 2 sets - 10 reps - 5 sec hold  ASSESSMENT:  CLINICAL IMPRESSION: Pt was able to complete all prescribed exercises today with no adverse effect. Exercises focused on LE strengthening and functional mobility. Progressing well post PLIF, HEP updated for continued strengthening.   Patient is a 63 y.o. female who was seen today for physical therapy evaluation and treatment for low back pain following L5-S1 fusion. Patient reports varying levels of discomfort but no true radicular symptoms.  BLE ROM and flexibility are good, no issues with neural tensioning but 30s chair stand test finds LE weakness and poor body mechanics.  Symptoms aggravated by prolonged positioning and activity.  Overall intensity of discomfort resolving over time.  Patient is a good rehab candidate with treatment focus on maintaining flexibility, building LE and core strength and functional training to improve mobility.  OBJECTIVE IMPAIRMENTS: Abnormal  gait, decreased activity tolerance, decreased endurance, decreased knowledge of condition, decreased mobility, difficulty walking, decreased ROM, decreased strength, postural dysfunction, and pain.   ACTIVITY LIMITATIONS: carrying, lifting, bending, sitting, standing, squatting, stairs, and bed mobility  PERSONAL FACTORS: Age, Fitness, Past/current experiences, and Time since onset of injury/illness/exacerbation are also affecting patient's functional outcome.   REHAB POTENTIAL: Good  CLINICAL DECISION MAKING: Evolving/moderate complexity  EVALUATION COMPLEXITY: Low   GOALS: Goals reviewed with patient? No  SHORT TERM GOALS: Target date: 12/30/2023  Patient to demonstrate independence in HEP  Baseline: Access Code: [email or web address] Goal status: INITIAL  2.  Assess 2 MWT Baseline: TBD Goal status: INITIAL   LONG TERM GOALS: Target date: 01/27/2024  Patient will acknowledge 4/10 pain at least once during episode of care   Baseline: 7/10 Goal status: INITIAL  2.  Patient will score at least 25/50 on ODI to signify clinically meaningful improvement in functional abilities.   Baseline: 35/50 Goal status: INITIAL  3.  Patient will increase 30s chair stand reps from 1 to 5 with/without arms to demonstrate and improved functional ability with less pain/difficulty as well as reduce fall risk.  Baseline: 1 Goal status: INITIAL  4.  Assess 2 MWT for progress Baseline: TBD Goal status: INITIAL    PLAN:  PT FREQUENCY: 1x/week  PT DURATION: 8 weeks  PLANNED INTERVENTIONS: 97110-Therapeutic exercises, 97530- Therapeutic activity, [id number]- Neuromuscular re-education, 97535- Self Care, 02859- Manual therapy, [phone]- Gait training, and Patient/Family education.  PLAN FOR NEXT SESSION: HEP review and update, manual techniques as appropriate, aerobic tasks, ROM and flexibility activities, strengthening and PREs, TPDN, gait and balance training as needed   For all possible CPT codes,  reference the Planned Interventions line above.     Check all conditions that are expected to impact treatment: {Conditions expected to impact treatment:Structural or anatomic abnormalities and Complications related to surgery   If treatment provided at initial evaluation, no treatment charged due to lack of authorization.       Alm JAYSON Kingdom, PT 12/11/2023, 1:11 PM

## 2023-12-13 ENCOUNTER — Ambulatory Visit

## 2023-12-13 NOTE — Therapy (Deleted)
 OUTPATIENT PHYSICAL THERAPY TREATMENT   Patient Name: Kendra Dodson MRN: [id number] DOB:1960/08/02, 63 y.o., female Today's Date: 12/13/2023  END OF SESSION:     Past Medical History:  Diagnosis Date   Asthma    Fibromyalgia    HLD (hyperlipidemia)    Hypertension    Hypothyroid    Migraines    PTSD (post-traumatic stress disorder)    Past Surgical History:  Procedure Laterality Date   ADENOIDECTOMY     CHOLECYSTECTOMY     knee arthoplasty Bilateral    TRANSFORAMINAL LUMBAR INTERBODY FUSION (TLIF) WITH PEDICLE SCREW FIXATION 1 LEVEL N/A 11/15/2023   Procedure: TRANSFORAMINAL LUMBAR INTERBODY FUSION (TLIF) WITH PEDICLE SCREW FIXATION 1 LEVEL;  Surgeon: Reyne Cordella SQUIBB, MD;  Location: [facility] OR;  Service: Orthopedics;  Laterality: N/A;   vaginal mesh     Patient Active Problem List   Diagnosis Date Noted   Hypothyroidism 10/26/2019    PCP: Kendra Christain FERNS, NP   REFERRING PROVIDER: Reyne Cordella SQUIBB, MD  REFERRING DIAG: TRANSFORAMINAL LUMBAR INTERBODY FUSION DONE AT LS - SI LEVEL  Rationale for Evaluation and Treatment: Rehabilitation  THERAPY DIAG:  No diagnosis found.  ONSET DATE: DOS 11/15/23  SUBJECTIVE:                                                                                                                                                                                           SUBJECTIVE STATEMENT: Pt presents to PT with reports of decreased pain compared to eval, roughly 4/10 today. Pt has been compliant with HEP.   PERTINENT HISTORY:  asthma, fibromyalgia, migraines, hypothyroidism, HTN, back pain, arthritis BIL knees October 2024 R TKA December 2024 L TKA  PAIN:  Are you having pain?  Yes: NPRS scale: 7/10 Pain location: lumbosacral region Pain description: ache, throb Aggravating factors: activity and position changes Relieving factors: position changes and rest, heat, aquatics  PRECAUTIONS: Other: fusion precautions  RED  FLAGS: None   WEIGHT BEARING RESTRICTIONS: No  FALLS:  Has patient fallen in last 6 months? No  OCCUPATION: not working currently  PLOF: Independent  PATIENT GOALS: To manage my back pain  NEXT MD VISIT: 4 weeks  OBJECTIVE:  Note: Objective measures were completed at Evaluation unless otherwise noted.  DIAGNOSTIC FINDINGS:  Two fluoroscopic spot views of the lumbar spine submitted from the operating room. Posterior rod with pedicle screw fixation and interbody spacer at L5-S1. Fluoroscopy time 1 minutes 26 seconds. Dose 66.04 mGy.   IMPRESSION: Intraoperative fluoroscopy during lumbar fusion.     Electronically Signed   By: Andrea Gasman M.D.   On: 11/15/2023 15:51  PATIENT SURVEYS:  Modified Oswestry: 35/50  Interpretation of scores: Score Category Description  0-20% Minimal Disability The patient can cope with most living activities. Usually no treatment is indicated apart from advice on lifting, sitting and exercise  21-40% Moderate Disability The patient experiences more pain and difficulty with sitting, lifting and standing. Travel and social life are more difficult and they may be disabled from work. Personal care, sexual activity and sleeping are not grossly affected, and the patient can usually be managed by conservative means  41-60% Severe Disability Pain remains the main problem in this group, but activities of daily living are affected. These patients require a detailed investigation  61-80% Crippled Back pain impinges on all aspects of the patient's life. Positive intervention is required  81-100% Bed-bound  These patients are either bed-bound or exaggerating their symptoms  [name], et al. Surgery versus conservative management of stable thoracolumbar fracture: the PRESTO feasibility RCT. [place] [employer]; 2021 Nov. [facility] Technology Assessment, No. 25.62.) Appendix 3, Oswestry Disability Index category  descriptors. Available from: FindJewel[employer].cz  Minimally Clinically Important Difference (MCID) = 12.8%   MUSCLE LENGTH: Hamstrings: Right 90 deg; Left 90 deg  POSTURE: No Significant postural limitations  PALPATION: deferred  LUMBAR ROM: deferred post-op  AROM eval  Flexion   Extension   Right lateral flexion   Left lateral flexion   Right rotation 50%  Left rotation 50%   (Blank rows = not tested)  LOWER EXTREMITY ROM:     Active  Right eval Left eval  Hip flexion    Hip extension    Hip abduction    Hip adduction    Hip internal rotation    Hip external rotation    Knee flexion    Knee extension    Ankle dorsiflexion    Ankle plantarflexion    Ankle inversion    Ankle eversion     (Blank rows = not tested)  LOWER EXTREMITY MMT:    MMT Right eval Left eval  Hip flexion    Hip extension    Hip abduction    Hip adduction    Hip internal rotation    Hip external rotation    Knee flexion    Knee extension    Ankle dorsiflexion    Ankle plantarflexion    Ankle inversion    Ankle eversion     (Blank rows = not tested)  LUMBAR SPECIAL TESTS:  Straight leg raise test: Negative and Slump test: Negative  FUNCTIONAL TESTS:  30 seconds chair stand test 1  GAIT: Distance walked: 47ft x2 Assistive device utilized: Single point cane Level of assistance: Complete Independence Comments: slow cadence  TREATMENT: [facility] Adult PT Treatment:                                                DATE: 12/11/2023 NuStep lvl 5 UE/LE x 4 min while taking subjective Supine PPT x 10 - 5 hold Supine PPT ball 2x10 - 5 hold Hooklying clamshell 2x15 blue band STS 2x10 no UE Standing hip abd 2x10 ea  PATIENT EDUCATION:  Education details: Discussed eval findings, rehab rationale and POC and patient is in agreement  Person educated: Patient Education method: Explanation and Handouts Education comprehension: verbalized understanding and  needs further education  HOME EXERCISE PROGRAM: Access Code: [email or web address] URL: [email or web address] Date: 12/11/2023 Prepared  by: Alm Kingdom  Exercises - Supine Posterior Pelvic Tilt  - 2-3 x daily - 5 x weekly - 1 sets - 10 reps - 3s hold - Supine March with Posterior Pelvic Tilt  - 2-3 x daily - 5 x weekly - 1 sets - 10 reps - Clamshell  - 2-3 x daily - 5 x weekly - 1 sets - 10-15 reps - Hooklying Clamshell with Resistance  - 1 x daily - 7 x weekly - 3 sets - 10 reps - blue band hold - Posterior Pelvic Tilt with Adduction Using Ball in Supine  - 1 x daily - 7 x weekly - 2 sets - 10 reps - 5 sec hold  ASSESSMENT:  CLINICAL IMPRESSION: Pt was able to complete all prescribed exercises today with no adverse effect. Exercises focused on LE strengthening and functional mobility. Progressing well post PLIF, HEP updated for continued strengthening.   Patient is a 63 y.o. female who was seen today for physical therapy evaluation and treatment for low back pain following L5-S1 fusion. Patient reports varying levels of discomfort but no true radicular symptoms.  BLE ROM and flexibility are good, no issues with neural tensioning but 30s chair stand test finds LE weakness and poor body mechanics.  Symptoms aggravated by prolonged positioning and activity.  Overall intensity of discomfort resolving over time.  Patient is a good rehab candidate with treatment focus on maintaining flexibility, building LE and core strength and functional training to improve mobility.  OBJECTIVE IMPAIRMENTS: Abnormal gait, decreased activity tolerance, decreased endurance, decreased knowledge of condition, decreased mobility, difficulty walking, decreased ROM, decreased strength, postural dysfunction, and pain.   ACTIVITY LIMITATIONS: carrying, lifting, bending, sitting, standing, squatting, stairs, and bed mobility  PERSONAL FACTORS: Age, Fitness, Past/current experiences, and Time since onset of  injury/illness/exacerbation are also affecting patient's functional outcome.   REHAB POTENTIAL: Good  CLINICAL DECISION MAKING: Evolving/moderate complexity  EVALUATION COMPLEXITY: Low   GOALS: Goals reviewed with patient? No  SHORT TERM GOALS: Target date: 12/30/2023  Patient to demonstrate independence in HEP  Baseline: Access Code: [email or web address] Goal status: INITIAL  2.  Assess 2 MWT Baseline: TBD Goal status: INITIAL   LONG TERM GOALS: Target date: 01/27/2024  Patient will acknowledge 4/10 pain at least once during episode of care   Baseline: 7/10 Goal status: INITIAL  2.  Patient will score at least 25/50 on ODI to signify clinically meaningful improvement in functional abilities.   Baseline: 35/50 Goal status: INITIAL  3.  Patient will increase 30s chair stand reps from 1 to 5 with/without arms to demonstrate and improved functional ability with less pain/difficulty as well as reduce fall risk.  Baseline: 1 Goal status: INITIAL  4.  Assess 2 MWT for progress Baseline: TBD Goal status: INITIAL    PLAN:  PT FREQUENCY: 1x/week  PT DURATION: 8 weeks  PLANNED INTERVENTIONS: 97110-Therapeutic exercises, 97530- Therapeutic activity, [id number]- Neuromuscular re-education, 97535- Self Care, 02859- Manual therapy, [phone]- Gait training, and Patient/Family education.  PLAN FOR NEXT SESSION: HEP review and update, manual techniques as appropriate, aerobic tasks, ROM and flexibility activities, strengthening and PREs, TPDN, gait and balance training as needed   For all possible CPT codes, reference the Planned Interventions line above.     Check all conditions that are expected to impact treatment: {Conditions expected to impact treatment:Structural or anatomic abnormalities and Complications related to surgery   If treatment provided at initial evaluation, no treatment charged due to lack of authorization.  Dyamon Sosinski M Armstead Heiland, PT 12/13/2023, 10:04 AM

## 2023-12-17 ENCOUNTER — Ambulatory Visit

## 2023-12-19 ENCOUNTER — Ambulatory Visit

## 2023-12-19 ENCOUNTER — Encounter

## 2023-12-19 DIAGNOSIS — M5459 Other low back pain: Secondary | ICD-10-CM

## 2023-12-19 DIAGNOSIS — M6281 Muscle weakness (generalized): Secondary | ICD-10-CM

## 2023-12-19 DIAGNOSIS — G8929 Other chronic pain: Secondary | ICD-10-CM

## 2023-12-19 DIAGNOSIS — M4326 Fusion of spine, lumbar region: Secondary | ICD-10-CM

## 2023-12-19 NOTE — Therapy (Signed)
 OUTPATIENT PHYSICAL THERAPY TREATMENT   Patient Name: Kendra Dodson MRN: [id number] DOB:02/20/1961, 63 y.o., female Today's Date: 12/19/2023  END OF SESSION:  PT End of Session - 12/19/23 1326     Visit Number 3    Number of Visits 8    Date for Recertification  02/01/24    Authorization Type [employer]    PT Start Time 1318    PT Stop Time 1400    PT Time Calculation (min) 42 min    Activity Tolerance Patient tolerated treatment well;Patient limited by pain    Behavior During Therapy WFL for tasks assessed/performed            Past Medical History:  Diagnosis Date   Asthma    Fibromyalgia    HLD (hyperlipidemia)    Hypertension    Hypothyroid    Migraines    PTSD (post-traumatic stress disorder)    Past Surgical History:  Procedure Laterality Date   ADENOIDECTOMY     CHOLECYSTECTOMY     knee arthoplasty Bilateral    TRANSFORAMINAL LUMBAR INTERBODY FUSION (TLIF) WITH PEDICLE SCREW FIXATION 1 LEVEL N/A 11/15/2023   Procedure: TRANSFORAMINAL LUMBAR INTERBODY FUSION (TLIF) WITH PEDICLE SCREW FIXATION 1 LEVEL;  Surgeon: Reyne Cordella SQUIBB, MD;  Location: [facility] OR;  Service: Orthopedics;  Laterality: N/A;   vaginal mesh     Patient Active Problem List   Diagnosis Date Noted   Hypothyroidism 10/26/2019    PCP: Shannan Christain FERNS, NP   REFERRING PROVIDER: Reyne Cordella SQUIBB, MD  REFERRING DIAG: TRANSFORAMINAL LUMBAR INTERBODY FUSION DONE AT LS - SI LEVEL  Rationale for Evaluation and Treatment: Rehabilitation  THERAPY DIAG:  Fusion of spine of lumbar region  Other low back pain  Muscle weakness (generalized)  Chronic pain of both knees  ONSET DATE: DOS 11/15/23  SUBJECTIVE:                                                                                                                                                                                           SUBJECTIVE STATEMENT: Doing alright with back, with sx localizing of low back pain. Some right sided  pain starting top of hip going down to my knee   PERTINENT HISTORY:  asthma, fibromyalgia, migraines, hypothyroidism, HTN, back pain, arthritis BIL knees October 2024 R TKA December 2024 L TKA  PAIN:  4/10 Are you having pain?  Yes: NPRS scale: 7/10 Pain location: lumbosacral region Pain description: ache, throb Aggravating factors: activity and position changes Relieving factors: position changes and rest, heat, aquatics  PRECAUTIONS: Other: fusion precautions  RED FLAGS: None   WEIGHT BEARING RESTRICTIONS: No  FALLS:  Has patient fallen in last 6 months? No  OCCUPATION: not working currently  PLOF: Independent  PATIENT GOALS: To manage my back pain  NEXT MD VISIT: 4 weeks  OBJECTIVE:  Note: Objective measures were completed at Evaluation unless otherwise noted.  DIAGNOSTIC FINDINGS:  Two fluoroscopic spot views of the lumbar spine submitted from the operating room. Posterior rod with pedicle screw fixation and interbody spacer at L5-S1. Fluoroscopy time 1 minutes 26 seconds. Dose 66.04 mGy.   IMPRESSION: Intraoperative fluoroscopy during lumbar fusion.     Electronically Signed   By: Andrea Gasman M.D.   On: 11/15/2023 15:51  PATIENT SURVEYS:  Modified Oswestry: 35/50  Interpretation of scores: Score Category Description  0-20% Minimal Disability The patient can cope with most living activities. Usually no treatment is indicated apart from advice on lifting, sitting and exercise  21-40% Moderate Disability The patient experiences more pain and difficulty with sitting, lifting and standing. Travel and social life are more difficult and they may be disabled from work. Personal care, sexual activity and sleeping are not grossly affected, and the patient can usually be managed by conservative means  41-60% Severe Disability Pain remains the main problem in this group, but activities of daily living are affected. These patients require a detailed  investigation  61-80% Crippled Back pain impinges on all aspects of the patient's life. Positive intervention is required  81-100% Bed-bound  These patients are either bed-bound or exaggerating their symptoms  [name], et al. Surgery versus conservative management of stable thoracolumbar fracture: the PRESTO feasibility RCT. [place] ([place]): [employer]; 2021 Nov. [facility] Technology Assessment, No. 25.62.) Appendix 3, Oswestry Disability Index category descriptors. Available from: FindJewelers.cz  Minimally Clinically Important Difference (MCID) = 12.8%   MUSCLE LENGTH: Hamstrings: Right 90 deg; Left 90 deg  POSTURE: No Significant postural limitations  PALPATION: deferred  LUMBAR ROM: deferred post-op  AROM eval  Flexion   Extension   Right lateral flexion   Left lateral flexion   Right rotation 50%  Left rotation 50%   (Blank rows = not tested)  LOWER EXTREMITY ROM:     Active  Right eval Left eval  Hip flexion    Hip extension    Hip abduction    Hip adduction    Hip internal rotation    Hip external rotation    Knee flexion    Knee extension    Ankle dorsiflexion    Ankle plantarflexion    Ankle inversion    Ankle eversion     (Blank rows = not tested)  LOWER EXTREMITY MMT:    MMT Right eval Left eval  Hip flexion    Hip extension    Hip abduction    Hip adduction    Hip internal rotation    Hip external rotation    Knee flexion    Knee extension    Ankle dorsiflexion    Ankle plantarflexion    Ankle inversion    Ankle eversion     (Blank rows = not tested)  LUMBAR SPECIAL TESTS:  Straight leg raise test: Negative and Slump test: Negative  FUNCTIONAL TESTS:  30 seconds chair stand test 1  GAIT: Distance walked: 39ft x2 Assistive device utilized: Single point cane Level of assistance: Complete Independence Comments: slow cadence  TREATMENT:  TREATMENT  12/19/2023:  Therapeutic Exercise: - nustep x4 min -PPT 2x10x3s -supine PPT w/ march x4 (DC p! Right hip and knee) -supine clamshell x8x3s  red, x8x3s blue  -supine PPT w/ march x8x3s -BL hip adduction with therapist holding red TB x8x3s -glute bridge x8x3s  -red TB HS curl x8x3s -STS x8 (HF angle greater than 90 degrees)   Therapeutic Activity: - reassessed and updated HEP      [facility] Adult PT Treatment:                                                DATE: 12/11/2023 NuStep lvl 5 UE/LE x 4 min while taking subjective Supine PPT x 10 - 5 hold Supine PPT ball 2x10 - 5 hold Hooklying clamshell 2x15 blue band STS 2x10 no UE Standing hip abd 2x10 ea STS x8   PATIENT EDUCATION:  Education details: Discussed eval findings, rehab rationale and POC and patient is in agreement  Person educated: Patient Education method: Explanation and Handouts Education comprehension: verbalized understanding and needs further education  HOME EXERCISE PROGRAM: Access Code: [email or web address] URL: [email or web address] Date: 12/11/2023 Prepared by: Alm Kingdom  Exercises All 2x/day and 5x/week 2x8x3s  - Hooklying Clamshell with Resistance  - 2 x daily - 5 x weekly - 3 sets - 8 reps x3s - blue band hold -red TB HS curl - Supine March with Posterior Pelvic Tilt  - Posterior Pelvic Tilt with Adduction Using Ball in Supine -STSx8   ASSESSMENT:  CLINICAL IMPRESSION: Patient tolerated therapy well with no significant increases in pain w/ progressions in core, BL LE and hip loading. Pt continues to have deficits with gross BL LE functional strength, activity tolerance, and excessive pain. Pt would continue to benefit from skilled PT to address aforementioned deficits.    Patient is a 63 y.o. female who was seen today for physical therapy evaluation and treatment for low back pain following L5-S1 fusion. Patient reports varying levels of discomfort but no true radicular symptoms.  BLE ROM  and flexibility are good, no issues with neural tensioning but 30s chair stand test finds LE weakness and poor body mechanics.  Symptoms aggravated by prolonged positioning and activity.  Overall intensity of discomfort resolving over time.  Patient is a good rehab candidate with treatment focus on maintaining flexibility, building LE and core strength and functional training to improve mobility.  OBJECTIVE IMPAIRMENTS: Abnormal gait, decreased activity tolerance, decreased endurance, decreased knowledge of condition, decreased mobility, difficulty walking, decreased ROM, decreased strength, postural dysfunction, and pain.   ACTIVITY LIMITATIONS: carrying, lifting, bending, sitting, standing, squatting, stairs, and bed mobility  PERSONAL FACTORS: Age, Fitness, Past/current experiences, and Time since onset of injury/illness/exacerbation are also affecting patient's functional outcome.   REHAB POTENTIAL: Good  CLINICAL DECISION MAKING: Evolving/moderate complexity  EVALUATION COMPLEXITY: Low   GOALS: Goals reviewed with patient? No  SHORT TERM GOALS: Target date: 12/30/2023  Patient to demonstrate independence in HEP  Baseline: Access Code: [email or web address] Goal status: INITIAL  2.  Assess 2 MWT Baseline: TBD Goal status: INITIAL   LONG TERM GOALS: Target date: 01/27/2024  Patient will acknowledge 4/10 pain at least once during episode of care   Baseline: 7/10 Goal status: INITIAL  2.  Patient will score at least 25/50 on ODI to signify clinically meaningful improvement in functional abilities.   Baseline: 35/50 Goal status: INITIAL  3.  Patient will increase 30s chair stand reps from 1 to 5 with/without arms to demonstrate and improved functional ability with less pain/difficulty as well  as reduce fall risk.  Baseline: 1 Goal status: INITIAL  4.  Assess 2 MWT for progress Baseline: TBD Goal status: INITIAL    PLAN:  PT FREQUENCY: 1x/week  PT DURATION: 8 weeks  PLANNED  INTERVENTIONS: 97110-Therapeutic exercises, 97530- Therapeutic activity, [id number]- Neuromuscular re-education, 97535- Self Care, 02859- Manual therapy, [phone]- Gait training, and Patient/Family education.  PLAN FOR NEXT SESSION:  HEP assessment and progression, symptom modulation, and loading (isolated/functional). Manual therapy, aerobic, gait, and NME training as needed.    For all possible CPT codes, reference the Planned Interventions line above.     Check all conditions that are expected to impact treatment: {Conditions expected to impact treatment:Structural or anatomic abnormalities and Complications related to surgery   If treatment provided at initial evaluation, no treatment charged due to lack of authorization.       [place] Odessia Scot  PT, DPT

## 2023-12-23 NOTE — Therapy (Unsigned)
 OUTPATIENT PHYSICAL THERAPY TREATMENT   Patient Name: Kendra Dodson MRN: [id number] DOB:24-Mar-1960, 63 y.o., female Today's Date: 12/25/2023  END OF SESSION:  PT End of Session - 12/25/23 1135     Visit Number 4    Number of Visits 8    Date for Recertification  02/01/24    Authorization Type [employer]    Authorization Time Period Approved 14 PT visits from 12/19/23-03/18/24    Authorization - Visit Number 4    Authorization - Number of Visits 14    PT Start Time 1130    PT Stop Time 1210    PT Time Calculation (min) 40 min    Activity Tolerance Patient tolerated treatment well;Patient limited by pain    Behavior During Therapy WFL for tasks assessed/performed             Past Medical History:  Diagnosis Date   Asthma    Fibromyalgia    HLD (hyperlipidemia)    Hypertension    Hypothyroid    Migraines    PTSD (post-traumatic stress disorder)    Past Surgical History:  Procedure Laterality Date   ADENOIDECTOMY     CHOLECYSTECTOMY     knee arthoplasty Bilateral    TRANSFORAMINAL LUMBAR INTERBODY FUSION (TLIF) WITH PEDICLE SCREW FIXATION 1 LEVEL N/A 11/15/2023   Procedure: TRANSFORAMINAL LUMBAR INTERBODY FUSION (TLIF) WITH PEDICLE SCREW FIXATION 1 LEVEL;  Surgeon: Reyne Cordella SQUIBB, MD;  Location: [facility] OR;  Service: Orthopedics;  Laterality: N/A;   vaginal mesh     Patient Active Problem List   Diagnosis Date Noted   Hypothyroidism 10/26/2019    PCP: Shannan Christain FERNS, NP   REFERRING PROVIDER: Reyne Cordella SQUIBB, MD  REFERRING DIAG: TRANSFORAMINAL LUMBAR INTERBODY FUSION DONE AT LS - SI LEVEL  Rationale for Evaluation and Treatment: Rehabilitation  THERAPY DIAG:  Other low back pain  Muscle weakness (generalized)  ONSET DATE: DOS 11/15/23  SUBJECTIVE:                                                                                                                                                                                           SUBJECTIVE  STATEMENT: Doing alright with back, with sx localizing of low back pain. Some right sided pain starting top of hip going down to my knee   PERTINENT HISTORY:  asthma, fibromyalgia, migraines, hypothyroidism, HTN, back pain, arthritis BIL knees October 2024 R TKA December 2024 L TKA  PAIN:  4/10 Are you having pain?  Yes: NPRS scale: 7/10 Pain location: lumbosacral region Pain description: ache, throb Aggravating factors: activity and position changes Relieving factors: position changes and rest, heat,  aquatics  PRECAUTIONS: Other: fusion precautions  RED FLAGS: None   WEIGHT BEARING RESTRICTIONS: No  FALLS:  Has patient fallen in last 6 months? No  OCCUPATION: not working currently  PLOF: Independent  PATIENT GOALS: To manage my back pain  NEXT MD VISIT: 4 weeks  OBJECTIVE:  Note: Objective measures were completed at Evaluation unless otherwise noted.  DIAGNOSTIC FINDINGS:  Two fluoroscopic spot views of the lumbar spine submitted from the operating room. Posterior rod with pedicle screw fixation and interbody spacer at L5-S1. Fluoroscopy time 1 minutes 26 seconds. Dose 66.04 mGy.   IMPRESSION: Intraoperative fluoroscopy during lumbar fusion.     Electronically Signed   By: Andrea Gasman M.D.   On: 11/15/2023 15:51  PATIENT SURVEYS:  Modified Oswestry: 35/50  Interpretation of scores: Score Category Description  0-20% Minimal Disability The patient can cope with most living activities. Usually no treatment is indicated apart from advice on lifting, sitting and exercise  21-40% Moderate Disability The patient experiences more pain and difficulty with sitting, lifting and standing. Travel and social life are more difficult and they may be disabled from work. Personal care, sexual activity and sleeping are not grossly affected, and the patient can usually be managed by conservative means  41-60% Severe Disability Pain remains the main problem in this  group, but activities of daily living are affected. These patients require a detailed investigation  61-80% Crippled Back pain impinges on all aspects of the patient's life. Positive intervention is required  81-100% Bed-bound  These patients are either bed-bound or exaggerating their symptoms  [name], et al. Surgery versus conservative management of stable thoracolumbar fracture: the PRESTO feasibility RCT. [place] ([place]): [employer]; 2021 Nov. [facility] Technology Assessment, No. 25.62.) Appendix 3, Oswestry Disability Index category descriptors. Available from: Find[employer]el[employer].cz  Minimally Clinically Important Difference (MCID) = 12.8%   MUSCLE LENGTH: Hamstrings: Right 90 deg; Left 90 deg  POSTURE: No Significant postural limitations  PALPATION: deferred  LUMBAR ROM: deferred post-op  AROM eval  Flexion   Extension   Right lateral flexion   Left lateral flexion   Right rotation 50%  Left rotation 50%   (Blank rows = not tested)  LOWER EXTREMITY ROM:     Active  Right eval Left eval  Hip flexion    Hip extension    Hip abduction    Hip adduction    Hip internal rotation    Hip external rotation    Knee flexion    Knee extension    Ankle dorsiflexion    Ankle plantarflexion    Ankle inversion    Ankle eversion     (Blank rows = not tested)  LOWER EXTREMITY MMT:    MMT Right eval Left eval  Hip flexion    Hip extension    Hip abduction    Hip adduction    Hip internal rotation    Hip external rotation    Knee flexion    Knee extension    Ankle dorsiflexion    Ankle plantarflexion    Ankle inversion    Ankle eversion     (Blank rows = not tested)  LUMBAR SPECIAL TESTS:  Straight leg raise test: Negative and Slump test: Negative  FUNCTIONAL TESTS:  30 seconds chair stand test 1  GAIT: Distance walked: 65ft x2 Assistive device utilized: Single point cane Level of assistance: Complete  Independence Comments: slow cadence  TREATMENT: [facility] Adult PT Treatment:  DATE: 12/25/23 Therapeutic Exercise: Nustep L4 8 min Neuromuscular re-ed: Supine hip fallouts GTB 15x B, 15/15 S/L clams GTB 15/15 Bridge against GTB 15x Bridge with ball squeeze 15x Therapeutic Activity: Seated hamstring stretch 30s x2 B Hip flexor stretch 30s x2 B P-ball curl ups 10x B, 10/10  TREATMENT 12/25/2023:  Therapeutic Exercise: - nustep x4 min -PPT 2x10x3s -supine PPT w/ march x4 (DC p! Right hip and knee) -supine clamshell x8x3s red, x8x3s blue  -supine PPT w/ march x8x3s -BL hip adduction with therapist holding red TB x8x3s -glute bridge x8x3s  -red TB HS curl x8x3s -STS x8 (HF angle greater than 90 degrees)   Therapeutic Activity: - reassessed and updated HEP      [facility] Adult PT Treatment:                                                DATE: 12/11/2023 NuStep lvl 5 UE/LE x 4 min while taking subjective Supine PPT x 10 - 5 hold Supine PPT ball 2x10 - 5 hold Hooklying clamshell 2x15 blue band STS 2x10 no UE Standing hip abd 2x10 ea STS x8   PATIENT EDUCATION:  Education details: Discussed eval findings, rehab rationale and POC and patient is in agreement  Person educated: Patient Education method: Explanation and Handouts Education comprehension: verbalized understanding and needs further education  HOME EXERCISE PROGRAM: Access Code: [email or web address] URL: [email or web address] Date: 12/25/2023 Prepared by: Reyes Kohut  Exercises - Supine Posterior Pelvic Tilt  - 2-3 x daily - 5 x weekly - 1 sets - 10 reps - 3s hold - Supine March with Posterior Pelvic Tilt  - 2-3 x daily - 5 x weekly - 1 sets - 10 reps - Clamshell  - 2-3 x daily - 5 x weekly - 1 sets - 10-15 reps - Hooklying Clamshell with Resistance  - 1 x daily - 7 x weekly - 3 sets - 10 reps - blue band hold - Posterior Pelvic Tilt with Adduction Using Ball in  Supine  - 1 x daily - 7 x weekly - 2 sets - 10 reps - 5 sec hold - Supine Bridge with Resistance Band  - 1 x daily - 5 x weekly - 1 sets - 15 reps - Supine Bridge with Mini Swiss Ball Between Knees  - 1 x daily - 5 x weekly - 2 sets - 15 reps  ASSESSMENT:  CLINICAL IMPRESSION:  Today's session focused on stretching and lumbosacral strengthening.   Added hamstring and hip flexor stretch.  Able to tolerate all tasks and noted symptom relief at end of session   Patient is a 63 y.o. female who was seen today for physical therapy evaluation and treatment for low back pain following L5-S1 fusion. Patient reports varying levels of discomfort but no true radicular symptoms.  BLE ROM and flexibility are good, no issues with neural tensioning but 30s chair stand test finds LE weakness and poor body mechanics.  Symptoms aggravated by prolonged positioning and activity.  Overall intensity of discomfort resolving over time.  Patient is a good rehab candidate with treatment focus on maintaining flexibility, building LE and core strength and functional training to improve mobility.  OBJECTIVE IMPAIRMENTS: Abnormal gait, decreased activity tolerance, decreased endurance, decreased knowledge of condition, decreased mobility, difficulty walking, decreased ROM, decreased strength, postural dysfunction, and pain.   ACTIVITY LIMITATIONS: carrying, lifting,  bending, sitting, standing, squatting, stairs, and bed mobility  PERSONAL FACTORS: Age, Fitness, Past/current experiences, and Time since onset of injury/illness/exacerbation are also affecting patient's functional outcome.   REHAB POTENTIAL: Good  CLINICAL DECISION MAKING: Evolving/moderate complexity  EVALUATION COMPLEXITY: Low   GOALS: Goals reviewed with patient? No  SHORT TERM GOALS: Target date: 12/30/2023  Patient to demonstrate independence in HEP  Baseline: Access Code: [email or web address] Goal status: INITIAL  2.  Assess 2 MWT Baseline: TBD Goal  status: INITIAL   LONG TERM GOALS: Target date: 01/27/2024  Patient will acknowledge 4/10 pain at least once during episode of care   Baseline: 7/10 Goal status: INITIAL  2.  Patient will score at least 25/50 on ODI to signify clinically meaningful improvement in functional abilities.   Baseline: 35/50 Goal status: INITIAL  3.  Patient will increase 30s chair stand reps from 1 to 5 with/without arms to demonstrate and improved functional ability with less pain/difficulty as well as reduce fall risk.  Baseline: 1 Goal status: INITIAL  4.  Assess 2 MWT for progress Baseline: TBD Goal status: INITIAL    PLAN:  PT FREQUENCY: 1x/week  PT DURATION: 8 weeks  PLANNED INTERVENTIONS: 97110-Therapeutic exercises, 97530- Therapeutic activity, [id number]- Neuromuscular re-education, 97535- Self Care, [id number]- Manual therapy, [id number]- Gait training, and Patient/Family education.  PLAN FOR NEXT SESSION:  HEP assessment and progression, symptom modulation, and loading (isolated/functional). Manual therapy, aerobic, gait, and NME training as needed.    For all possible CPT codes, reference the Planned Interventions line above.     Check all conditions that are expected to impact treatment: {Conditions expected to impact treatment:Structural or anatomic abnormalities and Complications related to surgery   If treatment provided at initial evaluation, no treatment charged due to lack of authorization.       [place] Odessia Scot  PT, DPT

## 2023-12-25 ENCOUNTER — Ambulatory Visit

## 2023-12-25 DIAGNOSIS — M6281 Muscle weakness (generalized): Secondary | ICD-10-CM

## 2023-12-25 DIAGNOSIS — M5459 Other low back pain: Secondary | ICD-10-CM

## 2024-04-01 LAB — COLOGUARD: COLOGUARD: NEGATIVE

## 2024-04-16 ENCOUNTER — Other Ambulatory Visit: Payer: Self-pay | Admitting: Physician Assistant

## 2024-04-16 DIAGNOSIS — J329 Chronic sinusitis, unspecified: Secondary | ICD-10-CM

## 2024-04-17 ENCOUNTER — Encounter: Payer: Self-pay | Admitting: Physician Assistant

## 2024-04-27 ENCOUNTER — Other Ambulatory Visit
